# Patient Record
Sex: Male | Born: 1965 | Race: White | Hispanic: No | Marital: Married | Smoking: Former smoker
Health system: Southern US, Community
[De-identification: ages and names within clinical notes are randomized; demographics above are authoritative.]

## PROBLEM LIST (undated history)

## (undated) DIAGNOSIS — E7800 Pure hypercholesterolemia, unspecified: Secondary | ICD-10-CM

## (undated) DIAGNOSIS — Z8489 Family history of other specified conditions: Secondary | ICD-10-CM

## (undated) DIAGNOSIS — F418 Other specified anxiety disorders: Secondary | ICD-10-CM

## (undated) DIAGNOSIS — M1990 Unspecified osteoarthritis, unspecified site: Secondary | ICD-10-CM

## (undated) HISTORY — PX: APPENDECTOMY: SHX54

---

## 1999-12-09 ENCOUNTER — Encounter: Payer: Self-pay | Admitting: Emergency Medicine

## 1999-12-09 ENCOUNTER — Emergency Department (HOSPITAL_COMMUNITY): Admission: EM | Admit: 1999-12-09 | Discharge: 1999-12-09 | Payer: Self-pay | Admitting: Emergency Medicine

## 2007-09-01 ENCOUNTER — Emergency Department (HOSPITAL_COMMUNITY): Admission: EM | Admit: 2007-09-01 | Discharge: 2007-09-01 | Payer: Self-pay | Admitting: Emergency Medicine

## 2010-12-27 ENCOUNTER — Encounter: Payer: Self-pay | Admitting: Emergency Medicine

## 2010-12-27 ENCOUNTER — Emergency Department (INDEPENDENT_AMBULATORY_CARE_PROVIDER_SITE_OTHER): Payer: BC Managed Care – PPO

## 2010-12-27 ENCOUNTER — Observation Stay (HOSPITAL_COMMUNITY)
Admission: AD | Admit: 2010-12-27 | Discharge: 2010-12-29 | Disposition: A | Discharge status: Home / Self Care | Payer: BC Managed Care – PPO | Source: Other Acute Inpatient Hospital | Attending: Family Medicine | Admitting: Family Medicine

## 2010-12-27 ENCOUNTER — Emergency Department (HOSPITAL_BASED_OUTPATIENT_CLINIC_OR_DEPARTMENT_OTHER)
Admission: EM | Admit: 2010-12-27 | Discharge: 2010-12-27 | Disposition: A | Discharge status: Other Acute Inpatient Hospital | Payer: BC Managed Care – PPO | Source: Home / Self Care | Attending: Emergency Medicine | Admitting: Emergency Medicine

## 2010-12-27 ENCOUNTER — Other Ambulatory Visit: Payer: Self-pay

## 2010-12-27 DIAGNOSIS — Z01812 Encounter for preprocedural laboratory examination: Secondary | ICD-10-CM | POA: Insufficient documentation

## 2010-12-27 DIAGNOSIS — Y9289 Other specified places as the place of occurrence of the external cause: Secondary | ICD-10-CM | POA: Insufficient documentation

## 2010-12-27 DIAGNOSIS — S0081XA Abrasion of other part of head, initial encounter: Secondary | ICD-10-CM

## 2010-12-27 DIAGNOSIS — S161XXA Strain of muscle, fascia and tendon at neck level, initial encounter: Secondary | ICD-10-CM

## 2010-12-27 DIAGNOSIS — I498 Other specified cardiac arrhythmias: Secondary | ICD-10-CM | POA: Insufficient documentation

## 2010-12-27 DIAGNOSIS — S0120XA Unspecified open wound of nose, initial encounter: Secondary | ICD-10-CM

## 2010-12-27 DIAGNOSIS — R55 Syncope and collapse: Principal | ICD-10-CM | POA: Insufficient documentation

## 2010-12-27 DIAGNOSIS — W19XXXA Unspecified fall, initial encounter: Secondary | ICD-10-CM | POA: Insufficient documentation

## 2010-12-27 DIAGNOSIS — S0180XA Unspecified open wound of other part of head, initial encounter: Secondary | ICD-10-CM | POA: Insufficient documentation

## 2010-12-27 DIAGNOSIS — M542 Cervicalgia: Secondary | ICD-10-CM

## 2010-12-27 DIAGNOSIS — F3289 Other specified depressive episodes: Secondary | ICD-10-CM | POA: Insufficient documentation

## 2010-12-27 DIAGNOSIS — F1210 Cannabis abuse, uncomplicated: Secondary | ICD-10-CM | POA: Insufficient documentation

## 2010-12-27 DIAGNOSIS — S01511A Laceration without foreign body of lip, initial encounter: Secondary | ICD-10-CM

## 2010-12-27 DIAGNOSIS — E876 Hypokalemia: Secondary | ICD-10-CM | POA: Insufficient documentation

## 2010-12-27 DIAGNOSIS — F329 Major depressive disorder, single episode, unspecified: Secondary | ICD-10-CM | POA: Insufficient documentation

## 2010-12-27 DIAGNOSIS — IMO0002 Reserved for concepts with insufficient information to code with codable children: Secondary | ICD-10-CM | POA: Insufficient documentation

## 2010-12-27 DIAGNOSIS — R51 Headache: Secondary | ICD-10-CM

## 2010-12-27 DIAGNOSIS — S139XXA Sprain of joints and ligaments of unspecified parts of neck, initial encounter: Secondary | ICD-10-CM | POA: Insufficient documentation

## 2010-12-27 DIAGNOSIS — Z01810 Encounter for preprocedural cardiovascular examination: Secondary | ICD-10-CM | POA: Insufficient documentation

## 2010-12-27 DIAGNOSIS — M47812 Spondylosis without myelopathy or radiculopathy, cervical region: Secondary | ICD-10-CM

## 2010-12-27 DIAGNOSIS — F17200 Nicotine dependence, unspecified, uncomplicated: Secondary | ICD-10-CM | POA: Insufficient documentation

## 2010-12-27 DIAGNOSIS — S01501A Unspecified open wound of lip, initial encounter: Secondary | ICD-10-CM | POA: Insufficient documentation

## 2010-12-27 DIAGNOSIS — E7800 Pure hypercholesterolemia, unspecified: Secondary | ICD-10-CM | POA: Insufficient documentation

## 2010-12-27 DIAGNOSIS — S0181XA Laceration without foreign body of other part of head, initial encounter: Secondary | ICD-10-CM

## 2010-12-27 DIAGNOSIS — M5030 Other cervical disc degeneration, unspecified cervical region: Secondary | ICD-10-CM

## 2010-12-27 DIAGNOSIS — F1410 Cocaine abuse, uncomplicated: Secondary | ICD-10-CM | POA: Insufficient documentation

## 2010-12-27 HISTORY — DX: Other specified anxiety disorders: F41.8

## 2010-12-27 LAB — DIFFERENTIAL
Basophils Absolute: 0.1 10*3/uL (ref 0.0–0.1)
Basophils Absolute: 0 10*3/uL (ref 0.0–0.1)
Basophils Relative: 1 % (ref 0–1)
Eosinophils Absolute: 0.9 10*3/uL — ABNORMAL HIGH (ref 0.0–0.7)
Eosinophils Relative: 8 % — ABNORMAL HIGH (ref 0–5)
Eosinophils Relative: 1 % (ref 0–5)
Lymphocytes Relative: 23 % (ref 12–46)
Lymphocytes Relative: 12 % (ref 12–46)
Lymphs Abs: 2.5 10*3/uL (ref 0.7–4.0)
Lymphs Abs: 2 10*3/uL (ref 0.7–4.0)
Monocytes Absolute: 0.7 10*3/uL (ref 0.1–1.0)
Monocytes Relative: 7 % (ref 3–12)
Neutro Abs: 6.9 10*3/uL (ref 1.7–7.7)
Neutrophils Relative %: 62 % (ref 43–77)
Neutrophils Relative %: 81 % — ABNORMAL HIGH (ref 43–77)

## 2010-12-27 LAB — CBC
HCT: 45.9 % (ref 39.0–52.0)
HCT: 45.9 % (ref 39.0–52.0)
Hemoglobin: 15.9 g/dL (ref 13.0–17.0)
MCH: 30.9 pg (ref 26.0–34.0)
MCHC: 34.6 g/dL (ref 30.0–36.0)
MCV: 89.3 fL (ref 78.0–100.0)
MCV: 89.3 fL (ref 78.0–100.0)
Platelets: 220 10*3/uL (ref 150–400)
Platelets: 231 10*3/uL (ref 150–400)
RBC: 5.14 MIL/uL (ref 4.22–5.81)
RBC: 5.14 MIL/uL (ref 4.22–5.81)
RDW: 13.3 % (ref 11.5–15.5)
RDW: 13.1 % (ref 11.5–15.5)
WBC: 11.1 10*3/uL — ABNORMAL HIGH (ref 4.0–10.5)
WBC: 15.8 10*3/uL — ABNORMAL HIGH (ref 4.0–10.5)

## 2010-12-27 LAB — BASIC METABOLIC PANEL
BUN: 10 mg/dL (ref 6–23)
CO2: 22 mEq/L (ref 19–32)
CO2: 27 mEq/L (ref 19–32)
Calcium: 9.4 mg/dL (ref 8.4–10.5)
Chloride: 105 mEq/L (ref 96–112)
Chloride: 106 mEq/L (ref 96–112)
GFR calc Af Amer: >60 mL/min (ref >60)
Glucose, Bld: 158 mg/dL — ABNORMAL HIGH (ref 70–99)
Potassium: 3.7 mEq/L (ref 3.5–5.1)
Potassium: 3.8 mEq/L (ref 3.5–5.1)
Sodium: 139 mEq/L (ref 135–145)

## 2010-12-27 LAB — D-DIMER, QUANTITATIVE: D-Dimer, Quant: 0.4 ug/mL-FEU (ref 0.00–0.48)

## 2010-12-27 LAB — CARDIAC PANEL(CRET KIN+CKTOT+MB+TROPI)
Relative Index: 1.6 (ref 0.0–2.5)
Total CK: 202 U/L (ref 7–232)
Troponin I: <0.3 ng/mL (ref <0.30)

## 2010-12-27 LAB — BASIC METABOLIC PANEL WITH GFR
BUN: 12 mg/dL (ref 6–23)
Creatinine, Ser: 0.9 mg/dL (ref 0.50–1.35)
GFR calc Af Amer: >60 mL/min (ref >60)
GFR calc non Af Amer: >60 mL/min (ref >60)

## 2010-12-27 LAB — TROPONIN I: Troponin I: <0.3 ng/mL (ref <0.30)

## 2010-12-27 MED ORDER — FENTANYL CITRATE 0.05 MG/ML IJ SOLN
50.0000 ug | Freq: Once | INTRAMUSCULAR | Status: AC
  Administered 2010-12-27: 50 ug via INTRAVENOUS
  Filled 2010-12-27: qty 2

## 2010-12-27 MED ORDER — LIDOCAINE-EPINEPHRINE-TETRACAINE (LET) SOLUTION
3.0000 mL | Freq: Once | NASAL | Status: AC
  Administered 2010-12-27: 3 mL via TOPICAL
  Filled 2010-12-27: qty 9

## 2010-12-27 MED ORDER — LIDOCAINE HCL (PF) 1 % IJ SOLN
5.0000 mL | Freq: Once | INTRAMUSCULAR | Status: AC
  Administered 2010-12-27: 5 mL

## 2010-12-27 MED ORDER — LIDOCAINE HCL (PF) 1 % IJ SOLN
INTRAMUSCULAR | Status: AC
  Administered 2010-12-27: 5 mL
  Filled 2010-12-27: qty 5

## 2010-12-27 MED ORDER — SODIUM CHLORIDE 0.9 % IV SOLN
Freq: Once | INTRAVENOUS | Status: AC
  Administered 2010-12-27: 12:00:00 via INTRAVENOUS

## 2010-12-27 MED ORDER — POTASSIUM CHLORIDE 10 MEQ/100ML IV SOLN
10.0000 meq | Freq: Once | INTRAVENOUS | Status: AC
  Administered 2010-12-27: 10 meq via INTRAVENOUS
  Filled 2010-12-27: qty 100

## 2010-12-27 MED ORDER — LIDOCAINE-EPINEPHRINE-TETRACAINE (LET) SOLUTION
3.0000 mL | Freq: Once | NASAL | Status: AC
  Administered 2010-12-27: 3 mL via TOPICAL

## 2010-12-27 MED ORDER — POTASSIUM CHLORIDE CRYS ER 20 MEQ PO TBCR
40.0000 meq | EXTENDED_RELEASE_TABLET | Freq: Once | ORAL | Status: AC
  Administered 2010-12-27: 40 meq via ORAL
  Filled 2010-12-27: qty 2

## 2010-12-27 NOTE — ED Notes (Signed)
Medic notified staff of cbg was 162 mg/dcltr.

## 2010-12-27 NOTE — ED Notes (Signed)
Patient stated he fell and was unsure if he lost consciousness.

## 2010-12-27 NOTE — ED Notes (Addendum)
I cleaned wounds with normal saline after nurse applied LET to numb. I used a syringe and i.v. Cath to flush. I then used cotton swab around nose, then I applied extra saline soaked gauze to wounds to loosen dried blood from edges.

## 2010-12-27 NOTE — ED Provider Notes (Signed)
History     CSN: [id number] Arrival date & time: 12/27/2010 11:20 AM  Chief Complaint  Patient presents with  . Facial Pain  . Shoulder Pain  . Headache  . Facial Laceration   HPI Comments: The patient arrives by EMS with c-collar in place and on spine board. The patient has an IV are established. The patient has dressings applied to his forehead due to an injury. The patient recalls standing briefly feeling some mild dizziness and then passing out and falling face first onto concrete. The patient has obvious traumatic injuries to his face and multiple abrasions to his left arm. He has some mild discomfort to his left shoulder but good range of motion. The patient denies any back pain or pain to his chest abdomen or his legs. The patient reports that he did not eat breakfast this morning but that is his usual. In she did develop diaphoresis but never had any chest pain or shortness of breath. The patient is otherwise healthy except for a history of depression for which he takes medications daily. He admits to smoking and smoking marijuana. He denies any drug use. He reports he was knocked unconscious for very long if any. He denies any shaking of his limbs or any apparent seizure activity. He did not have any bowel or bladder incontinence. Patient's mother reports no family history of seizures. Patient has moderate pain around his face and forehead. The patient reports some mild neck pain more to the left side. He denies any recent long distance travel. She denies any black, tarry, bloody stools. He denies any recent fevers, cold symptoms, cough.  Patient is a 45 y.o. male presenting with shoulder pain and headaches. The history is provided by the patient.  Shoulder Pain Associated symptoms include headaches.  Headache     Past Medical History  Diagnosis Date  . Depression with anxiety     History reviewed. No pertinent past surgical history.  History reviewed. No pertinent family  history.  History  Substance Use Topics  . Smoking status: Current Everyday Smoker -- 1.0 packs/day for 10 years    Types: Cigarettes  . Smokeless tobacco: Not on file  . Alcohol Use: No      Review of Systems  Neurological: Positive for headaches.  All other systems reviewed and are negative.    Physical Exam  BP 122/90  Pulse 73  Temp(Src) 97.9 F (36.6 C) (Oral)  Resp 20  SpO2 97%  Physical Exam  Constitutional: He appears well-developed and well-nourished.  HENT:  Head:    Right Ear: External ear normal.  Left Ear: External ear normal.  Eyes: Conjunctivae are normal. Pupils are equal, round, and reactive to light.  Cardiovascular: Normal rate and regular rhythm.   Pulmonary/Chest: Breath sounds normal. He has no wheezes. He has no rales.  Abdominal: Soft. Bowel sounds are normal. He exhibits no distension. There is no tenderness. There is no rebound.  Musculoskeletal:       Left shoulder: He exhibits tenderness. He exhibits normal range of motion, no bony tenderness, no swelling, no crepitus, no deformity and normal strength.       Arms: Skin: Skin is warm. He is diaphoretic. No pallor.    ED Course  LACERATION REPAIR Date/Time: 12/27/2010 2:54 PM Performed by: Lear Ng Authorized by: Lear Ng Consent: Verbal consent obtained. Written consent not obtained. Consent given by: patient Patient understanding: patient states understanding of the procedure being performed Patient consent: the patient's understanding  of the procedure matches consent given Patient identity confirmed: verbally with patient Time out: Immediately prior to procedure a "time out" was called to verify the correct patient, procedure, equipment, support staff and site/side marked as required. Body area: head/neck Location details: forehead Laceration length: 3 cm Tendon involvement: none Nerve involvement: none Vascular damage: no Anesthesia: local infiltration Local  anesthetic: LET (lido,epi,tetracaine) and lidocaine 1% without epinephrine Patient sedated: no Irrigation solution: saline Irrigation method: syringe Amount of cleaning: standard Debridement: minimal Degree of undermining: none Skin closure: 4-0 Prolene Number of sutures: 3 Technique: simple Approximation: loose Approximation difficulty: simple Patient tolerance: Patient tolerated the procedure well with no immediate complications.  LACERATION REPAIR Date/Time: 12/27/2010 2:55 PM Performed by: Lear Ng Authorized by: Lear Ng Consent: Verbal consent obtained. Written consent not obtained. Risks and benefits: risks, benefits and alternatives were discussed Consent given by: patient Patient understanding: patient states understanding of the procedure being performed Patient consent: the patient's understanding of the procedure matches consent given Procedure consent: procedure consent matches procedure scheduled Patient identity confirmed: verbally with patient Body area: head/neck Location details: lower lip Full thickness lip laceration: yes Vermillion border involved: yes Lip laceration height: more than half vertical height Laceration length: 2.5 cm Tendon involvement: none Nerve involvement: none Vascular damage: no Anesthesia: local infiltration Local anesthetic: lidocaine 1% without epinephrine and LET (lido,epi,tetracaine) Anesthetic total: 1 ml Patient sedated: no Irrigation solution: saline Irrigation method: syringe Amount of cleaning: standard Debridement: none Skin closure: 5-0 Prolene Number of sutures: 3 Technique: simple Approximation: close Approximation difficulty: complex Lip approximation: vermillion border well aligned Patient tolerance: Patient tolerated the procedure well with no immediate complications. Comments: Explained to patient the difficulty of aligning Vermillion border as laceration involved corner of lip.  I did noo  suture deeper into inside of lip as it was approximated and MM heals well.      MDM ECG at 11:23 AM shows rate of 70 with multiple PAC's in bigeminy pattern, normal axis, normal intervals, normal ST and T wave segments  No prior ECG  Patient appears to have had true syncope despite the fact that he claims he felt mildly dizzy. The patient obviously fell face forward without much attempt to catch himself. He denies any seizure activity and had urinary incontinence I don't favor seizure although it is possible. He did not have any chest pain or shortness of breath. However given his syncope we'll check cardiac markers, EKG, blood tests, d-dimer. Patient will need suturing of his forehead and lip lacerations. The patient may require admission for his syncope workup. His primary care doctor is Dr. Paulino Rily would equal at [facility] [place].      2:59 PM Pt reports no palpitations, CP.  Still with cervical pain and lower cervical upper T spine tender, although more lateral to left, will get CT of C spine as well.  No focal neuro deficits to suggest cord injury.  Will admit for true syncope and need for tele monitoring and observation.  Sutures of face can be removed in 3- 4 days.    K+ ordered prior to transport after discussion with Dr. Jordan Hawks who accepts pt to [facility], team 6, tele bed  [name] [name]. Alainah Phang, MD 12/27/10 [phone]

## 2010-12-28 ENCOUNTER — Inpatient Hospital Stay (HOSPITAL_COMMUNITY): Payer: BC Managed Care – PPO

## 2010-12-28 DIAGNOSIS — R55 Syncope and collapse: Secondary | ICD-10-CM

## 2010-12-28 LAB — URINE DRUGS OF ABUSE SCREEN W ALC, ROUTINE (REF LAB)
Amphetamine Screen, Ur: NEGATIVE
Barbiturate Quant, Ur: NEGATIVE
Cocaine Metabolites: POSITIVE — AB
Creatinine,U: 86.9 mg/dL
Ethyl Alcohol: <10 mg/dL (ref <10)
Methadone: NEGATIVE
Phencyclidine (PCP): NEGATIVE

## 2010-12-28 LAB — TSH: TSH: 0.697 u[IU]/mL (ref 0.350–4.500)

## 2010-12-28 LAB — URINALYSIS, ROUTINE W REFLEX MICROSCOPIC
Bilirubin Urine: NEGATIVE
Glucose, UA: NEGATIVE mg/dL
Hgb urine dipstick: NEGATIVE
Specific Gravity, Urine: 1.008 (ref 1.005–1.030)
pH: 7 (ref 5.0–8.0)

## 2010-12-28 LAB — CARDIAC PANEL(CRET KIN+CKTOT+MB+TROPI)
CK, MB: 2.7 ng/mL (ref 0.3–4.0)
Relative Index: 1.7 (ref 0.0–2.5)
Total CK: 162 U/L (ref 7–232)
Troponin I: <0.3 ng/mL (ref <0.30)

## 2011-01-01 LAB — COCAINE, URINE, CONFIRMATION: Benzoylecgonine GC/MS Conf: 276 NG/ML — ABNORMAL HIGH

## 2011-01-24 NOTE — Discharge Summary (Signed)
  NAME:  [name] [name], [name] [name]               ACCOUNT NO.:  [id number]  MEDICAL RECORD NO.:  [id number]  LOCATION:  4742                         FACILITY:  [facility]  PHYSICIAN:  Tarry Kos, MD       DATE OF BIRTH:  13-Oct-1965  DATE OF ADMISSION:  12/27/2010 DATE OF DISCHARGE:  12/29/2010                              DISCHARGE SUMMARY   DISCHARGE DIAGNOSES: 1. Syncopal episode. 2. Polysubstance abuse including cocaine. 3. Facial lacerations from syncopal episode with stitches placed on     December 27, 2010.  SUMMARY OF HOSPITAL COURSE:  [name] is a 45 year old male who presented to the ED after having a syncopal episode, fell face forward, and suffered some injuries to his facial and forehead area.  These were repaired by the ED.  He was admitted for workup.  He had a MRI which was negative, a 2-D echo which was also normal, and carotid Dopplers which preliminary report is normal.  His telemetry did not reveal any arrhythmias, however, his urine drug screen was positive for cocaine and marijuana.  I did speak to [name] and he says that he rarely uses cocaine, however, I have strongly advised him against using cocaine in the future.  I have explained that cocaine can cause deadly cardiac arrest due to coronary vasospasms and arrhythmias and this could have been the cause of syncopal episode.  He understands this and says he is not going to proceed with any cocaine use in the future.  His workup here was negative and he had serial cardiac enzymes which were all negative.  Thyroid studies were normal.  Urinalysis was negative.  D- dimer was normal.  White count was normal.  Hemoglobin was normal. Electrolytes were normal.  BUN and creatinine were normal.  Chest x-ray showed COPD changes with no acute abnormalities.  The patient is being discharged home to follow up with primary care physician within the next week.  He will need to have his sutures removed on Friday.  PHYSICAL  EXAMINATION:  VITAL SIGNS:  He has been afebrile.  Vital signs stable. GENERAL:  Alert and oriented x4.  No apparent stress, cooperative, and friendly. HEENT:  Extraocular muscles intact.  Pupils equal, round, and reactive to light.  Oropharynx clear.  Mucous membranes moist. NECK:  No JVD, no carotid bruits. COR:  Regular rate and rhythm without murmurs, rubs, or gallops. CHEST:  Clear to auscultation bilaterally.  No wheezes or rales. ABDOMEN:  Soft and nontender.  Positive bowel sounds.  No hepatosplenomegaly. EXTREMITIES:  No clubbing, cyanosis, or edema. PSYCH:  Normal affect. NEURO:  Cranial nerves II-XII grossly intact.  No focal neurologic deficits.  5/5 strength in upper and lower extremities.  He had no focal neurologic deficits throughout this hospitalization. SKIN:  No rashes.          ______________________________ Tarry Kos, MD     RD/MEDQ  D:  12/29/2010  T:  12/29/2010  Job:  [id number]  Electronically Signed by Tarry Kos MD on 01/24/2011 11:54:55 AM

## 2011-01-27 NOTE — H&P (Signed)
NAME:  [name], [name]               ACCOUNT NO.:  [id number]  MEDICAL RECORD NO.:  [id number]  LOCATION:  4742                         FACILITY:  [facility]  PHYSICIAN:  Jonny Ruiz, MD    DATE OF BIRTH:  08-Sep-1965  DATE OF ADMISSION:  12/27/2010 DATE OF DISCHARGE:                             HISTORY & PHYSICAL   CHIEF COMPLAINT:  Passed out.  HISTORY OF PRESENT ILLNESS:  The patient is a 45 year old healthy man who was in his usual state of health until this morning when he went to work while standing in front of his Zenaida Niece, he suddenly collapsed and fell forward causing injuries in his forehead requiring suture repair.  The patient states that he had no warning symptoms prior to falling including headaches, dizziness, lightheadedness, nausea, diaphoresis, chest pain, shortness of breath or palpitations.  He has no history of cardiac or pulmonary disorders.  No history of seizures.  Denied any tongue biting or urinary incontinence.  The patient did not have any postsyncopal headache other than the headache.  The patient did have some neck pain and shoulder pain after the fall.  The patient was seen initially at [facility] and was subsequently directly admitted to telemetry unit on room 4742 for further workup and management.  At the present time the patient is asymptomatic.  He has never had syncopal episodes before.  PAST MEDICAL HISTORY: 1. Hypercholesterolemia. 2. Appendectomy at age 33. 3. Irritability/anger issues.  MEDICATIONS:  Sertraline 100 mg a day.  ALLERGIES:  No known drug allergies.  SOCIAL HISTORY:  The patient lives with his wife and 2 children, ages 99 and 85, boy and girl respectively.  He works for [employer].  He smokes 1 pack per day for many years.  Alcohol almost none he says. Last drink about 6 months ago.  Drugs, he smokes weed.  FAMILY HISTORY:  Mother is alive and suffers from thyroid disorder. Father is alive and has  extensive coronary artery disease requiring CABG.  He only has one half-sister who had a stroke.  She is 13 years younger than him.  REVIEW OF SYSTEMS:  CONSTITUTIONAL:  Denies fever, chills, sweats, weight changes or adenopathy.  HEENT:  Denies headache, sore throat, nasal congestion, nasal bleed, nasal discharge, phonophobia, photophobia or visual or hearing impairment.  SKIN:  He has got abrasions and lacerations in the forehead, nose and upper lip.  CARDIOPULMONARY: Denies chest pain, shortness of breath, palpitations, dyspnea on exertion, orthopnea, nocturia or PND.  Denies claudication.  He does have a morning cough that has been with him for many years.  Denies wheezing.  GI:  Denies nausea, vomiting, diarrhea, hematochezia or melena.  Denies changes in bowel habits.  Denies reflux.  GU:  Denies dysuria, frequency or hematuria.  MUSCULOSKELETAL:  Positive for neck pain.  NEUROPSYCHIATRIC:  The patient states that he is not depressed and does not suffer from anxiety and he takes sertraline for anger issues which has helped.  ENDOCRINE:  Denies polyuria, polydipsia, heat or cold intolerance.  PHYSICAL EXAMINATION:  VITAL SIGNS:  Temperature 98.1, pulse lying down 68, pulse standing 66, blood pressure lying 128/79, standing 138/87, respirations 18, pulse ox  98%. GENERAL APPEARANCE:  The patient is a middle age Caucasian man who appears in no acute distress. HEENT:  The patient has abrasions on his forehead with a laceration as well as deep abrasion on the nose as well as a small abrasion with superficial laceration on the upper lip.  PERRLA, EOMI, no epistaxis. NECK:  Without JVD or carotid bruits.  Normal thyroid.  No adenopathy. CARDIOVASCULAR:  Regular S1 and S2 without gallops, murmurs or rubs. LUNGS:  Clear to auscultation. ABDOMEN:  Nondistended with normal bowel sounds, soft and nontender without organomegaly or masses palpable.  EXTREMITIES:  With no clubbing, cyanosis or  edema. NEUROLOGICAL:  Awake and oriented x3, cranial nerves II-XII grossly intact, strength 5/5 in all extremities and axial groups, normal sensation throughout, normal cerebellar function, no nystagmus. GENITAL EXAM AND RECTAL:  Deferred.  RADIOLOGY:  CT scan of C-spine, no acute osseous abnormalities. Multilevel cervical spine degenerative disease and multilevel mild degenerative disk disease and left greater than right facet disease with left foraminal encroachment.  CT head without contrast, nasal bridge and right forehead lacerations.  Normal CT brain.  No fracture.  LABORATORY FINDINGS:  D-dimer and troponin negative.  Basic metabolic panel normal.  CBC; white count 11.1, normal hemoglobin, hematocrit and platelet count.  IMPRESSION:  A 45 year old white male previously healthy presenting with syncopal episode and multiple lacerations and abrasions in the forehead as a result of the syncope.  PLAN:  The patient will be admitted to telemetry floor for further workup and management.  His workup will include serial cardiac enzymes, TSH, chest x-ray, echocardiogram, carotid ultrasound, EEG, and brain MRI.  The patient will be monitored in telemetry for at least 1 day and management will be pending results of these tests and evaluations.  He will continue on sertraline 100 mg a day.  DVT prophylaxis with Lovenox per protocol.  Nicotine patch 21 mg per day.  Tobacco cessation counseling will be provided.          ______________________________ Jonny Ruiz, MD     GL/MEDQ  D:  12/27/2010  T:  12/27/2010  Job:  [id number]  Electronically Signed by Jonny Ruiz MD on 01/27/2011 04:06:29 PM

## 2012-03-29 IMAGING — CT CT CERVICAL SPINE W/O CM
3 of 4 series · 13 of 33 positions shown, 16 images · non-contrast
Comparison: None.

CLINICAL DATA: Headache.  Neck pain.  Facial laceration.

CT CERVICAL SPINE WITHOUT CONTRAST
TECHNIQUE: Multidetector CT imaging of the cervical spine was
performed. Multiplanar CT image reconstructions were also
generated.

[Series 3: c_spine 2.0 b41s st · axial · 0.28mm/px · z∈[+1108,+1224]mm · 5 of 88 slices shown, 7 images]
[im 15/88  soft-tissue]
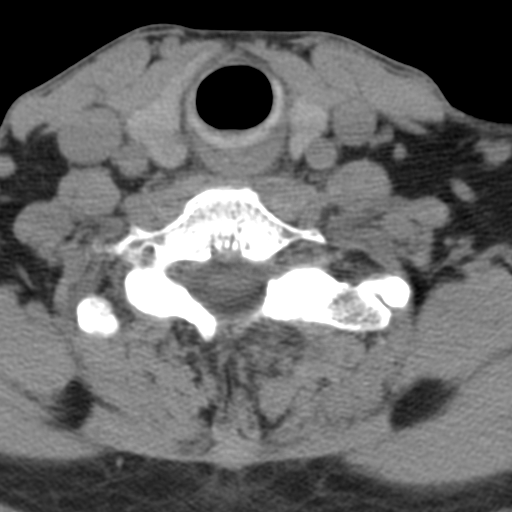
[im 15/88  bone]
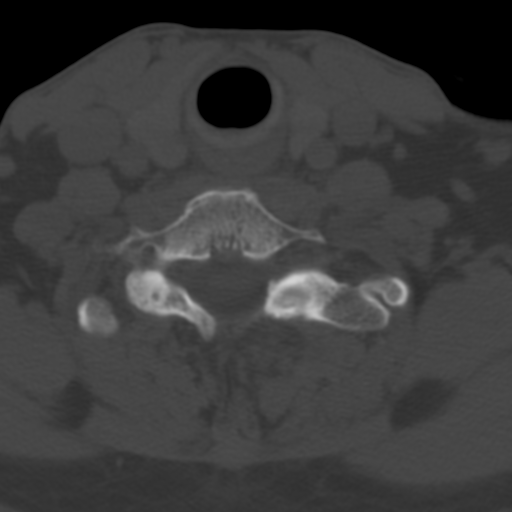
[im 30/88  bone]
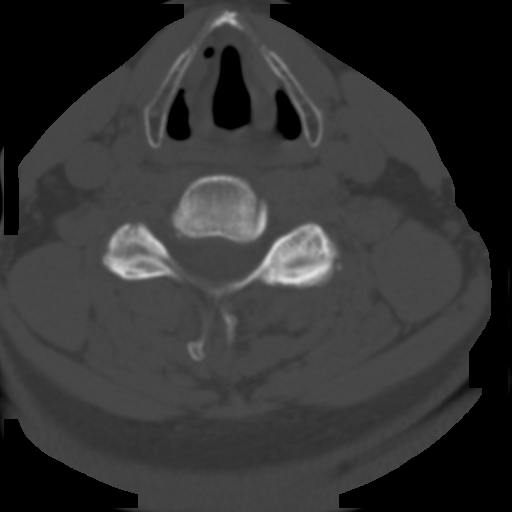
[im 44/88  bone]
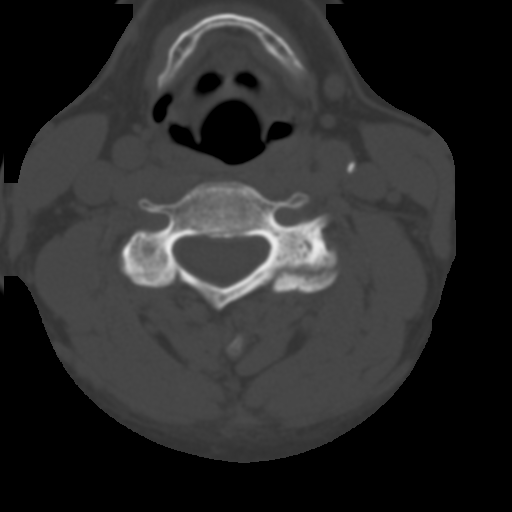
[im 59/88  bone]
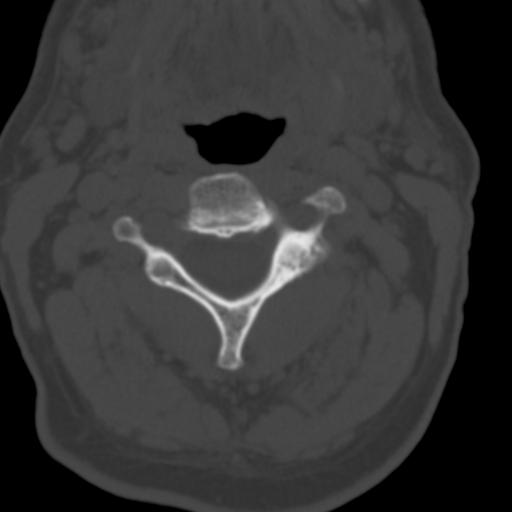
[im 73/88  soft-tissue]
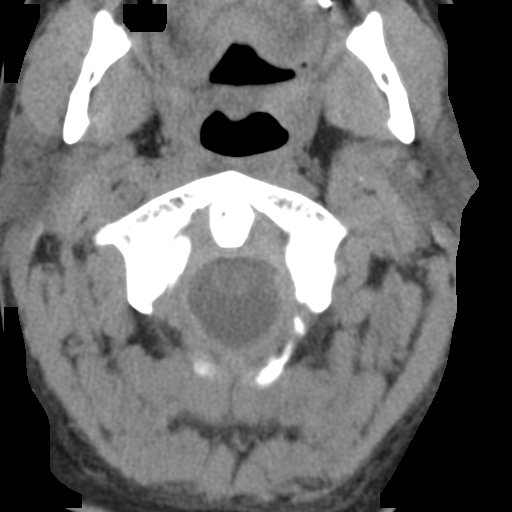
[im 73/88  bone]
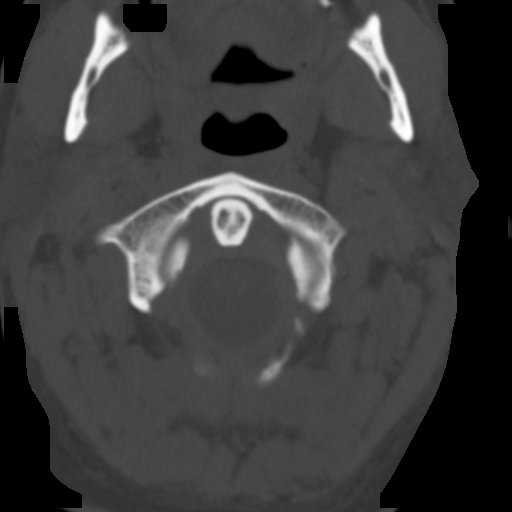

[Series 6: c_spine 2.0 coronal · coronal · 0.27mm/px · 3 of 33 slices shown]
[im 7/33  bone]
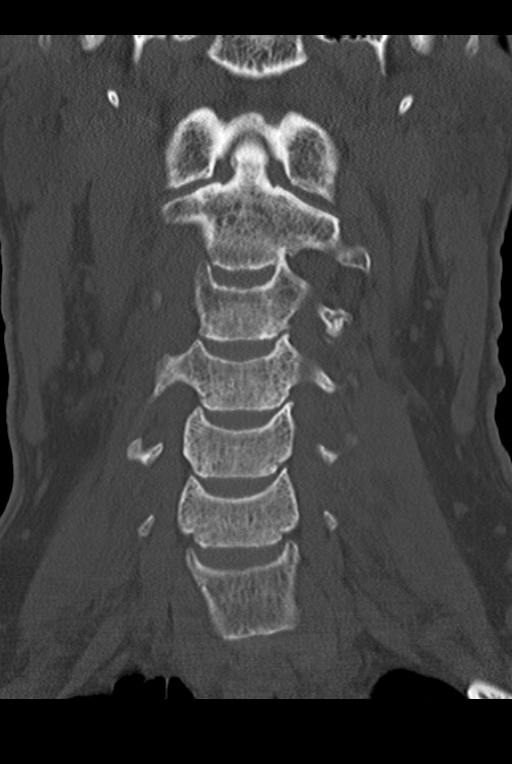
[im 13/33  bone]
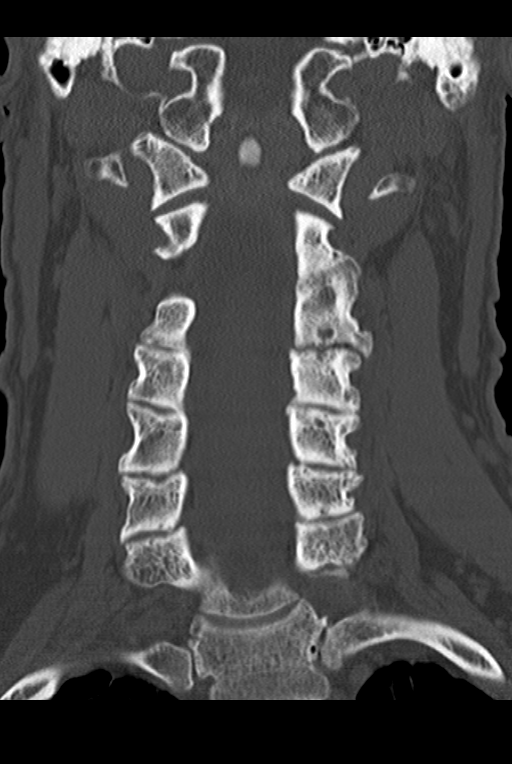
[im 20/33  bone]
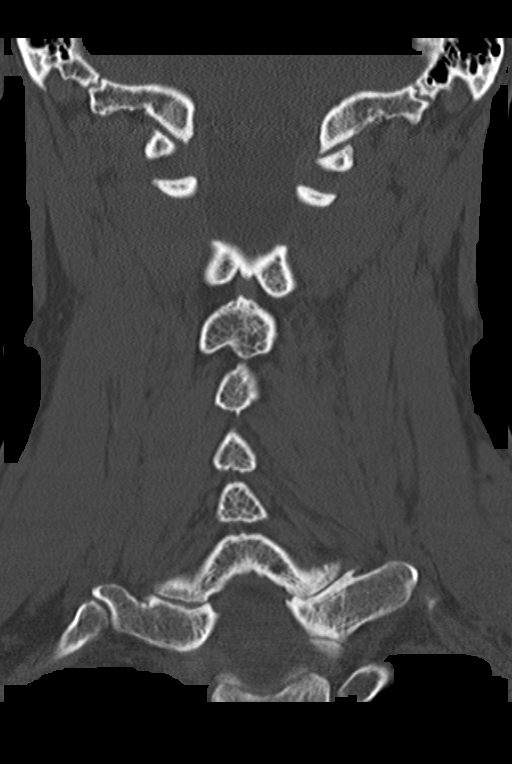

[Series 7: c_spine 2.0 sagittal · sagittal · 0.33mm/px · 5 of 54 slices shown, 6 images]
[im 18/54  bone]
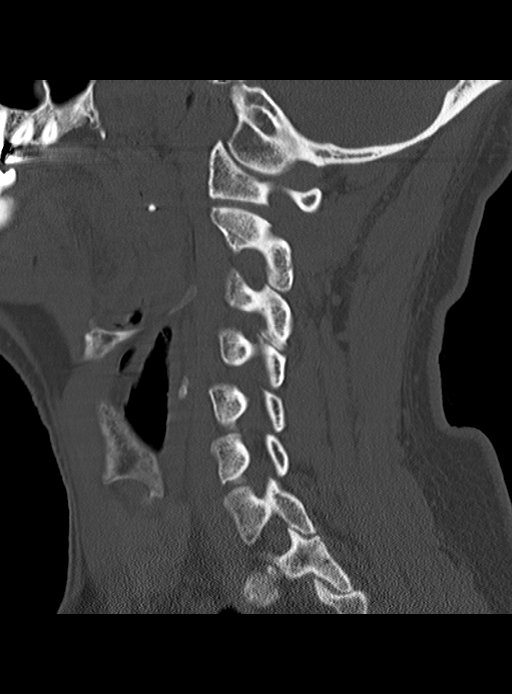
[im 23/54  bone]
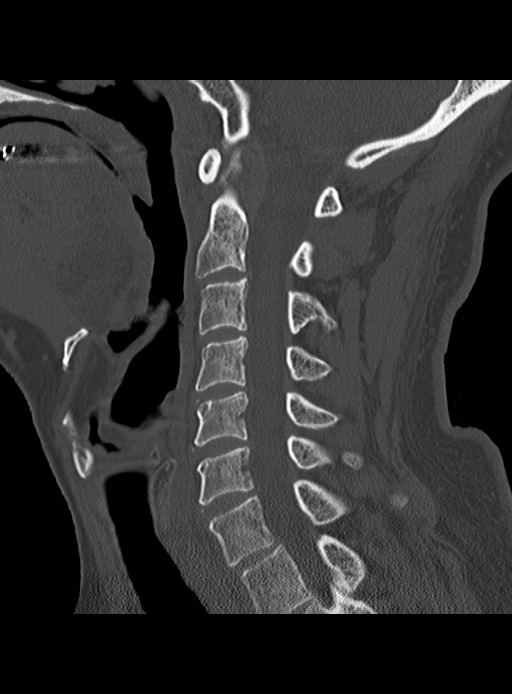
[im 27/54  soft-tissue]
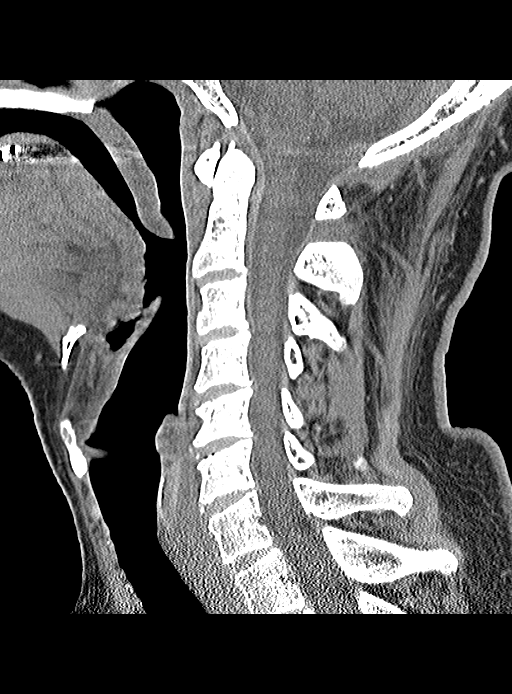
[im 27/54  bone]
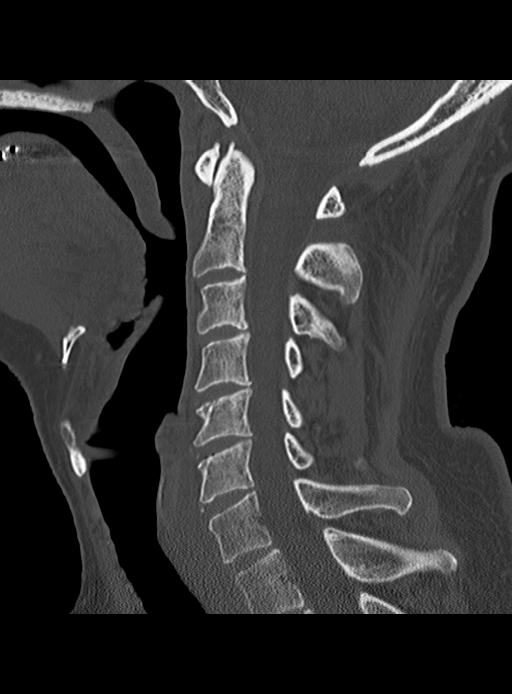
[im 31/54  bone]
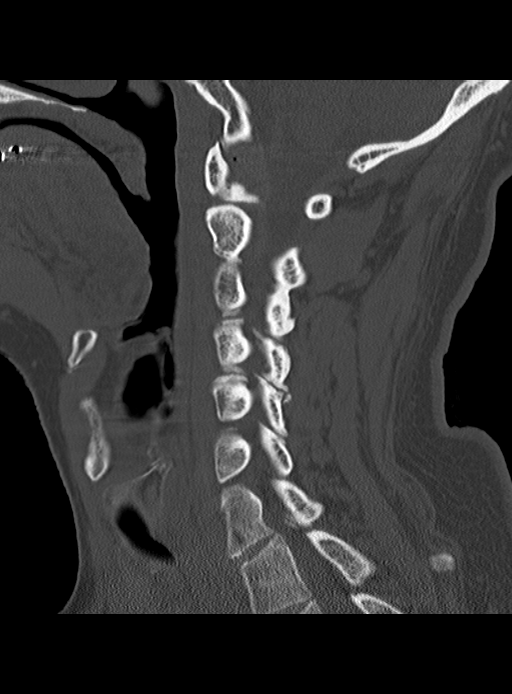
[im 36/54  bone]
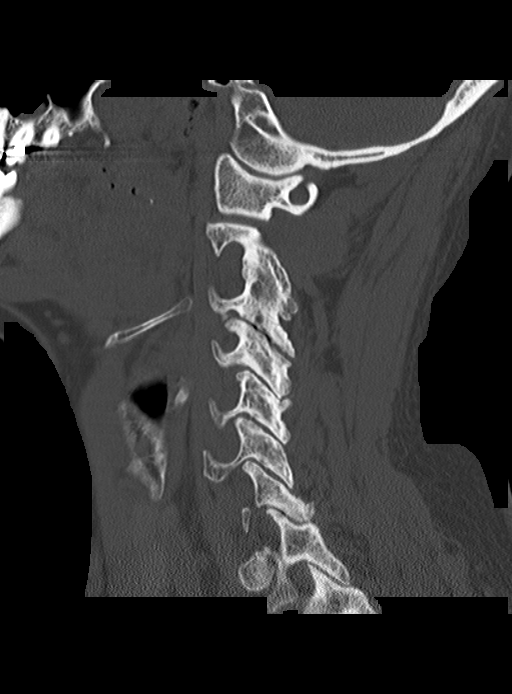

[13 of 33 positions shown; findings below may reference images not displayed]

FINDINGS: There is no cervical collar in place.  The alignment
cervical spine is anatomic.  There is no cervical spine fracture or
dislocation.  Craniocervical alignment is normal.  Ankylosis of the
left C2-C3 facet joints.  Right facet joints remain open.

Multilevel mild cervical spondylosis is present along with cervical
facet arthrosis.  Tonsiliths are incidentally noted.  The
paraspinal soft tissues are within normal limits.  Right apical
emphysema is present.

C2-C3:  Left uncovertebral spurring and left facet arthrosis with
left foraminal encroachment.
C3-C4:  Left foraminal encroachment secondary uncovertebral
spurring and severe facet arthrosis.
C4-C5:  Left greater than right facet arthrosis and foraminal
encroachment with left uncovertebral spurring.
C5-C6:  Bilateral left greater than right moderate bilateral facet
arthrosis.  Mild left foraminal encroachment.
C6-C7:  Right facet degeneration.  No stenosis.
C7-T1:  Negative.
IMPRESSION: No acute osseous abnormality.    Multilevel cervical spine
degenerative disease and multilevel mild degenerative disc disease
and left greater than right facet disease with left foraminal
encroachment.

## 2015-10-12 ENCOUNTER — Ambulatory Visit (INDEPENDENT_AMBULATORY_CARE_PROVIDER_SITE_OTHER): Payer: PRIVATE HEALTH INSURANCE | Admitting: Physician Assistant

## 2015-10-12 VITALS — BP 130/90 | HR 67 | Temp 97.9°F | Resp 16 | Ht 71.5 in | Wt 217.0 lb

## 2015-10-12 DIAGNOSIS — S51811A Laceration without foreign body of right forearm, initial encounter: Secondary | ICD-10-CM | POA: Diagnosis not present

## 2015-10-12 NOTE — Patient Instructions (Addendum)
WOUND CARE Please return in 10 days to have your stitches/staples removed or sooner if you have concerns. . Keep area clean and dry for 24 hours. Do not remove bandage, if applied. . After 24 hours, remove bandage and wash wound gently with mild soap and warm water. Reapply a new bandage after cleaning wound, if directed. . Continue daily cleansing with soap and water until stitches/staples are removed. . Do not apply any ointments or creams to the wound while stitches/staples are in place, as this may cause delayed healing. . Notify the office if you experience any of the following signs of infection: Swelling, redness, pus drainage, streaking, fever >101.0 F . Notify the office if you experience excessive bleeding that does not stop after 15-20 minutes of constant, firm pressure.      IF you received an x-ray today, you will receive an invoice from [facility]. Please contact [facility] at [phone] with questions or concerns regarding your invoice.   IF you received labwork today, you will receive an invoice from [facility]. Please contact [facility] at [phone] with questions or concerns regarding your invoice.   Our billing staff will not be able to assist you with questions regarding bills from these companies.  You will be contacted with the lab results as soon as they are available. The fastest way to get your results is to activate your My Chart account. Instructions are located on the last page of this paperwork. If you have not heard from us regarding the results in 2 weeks, please contact this office.      

## 2015-10-15 NOTE — Progress Notes (Signed)
   10/15/2015 11:16 AM   DOB: 03-Aug-1965 / MRN: [id number]  SUBJECTIVE:  [name] [name] is a 50 y.o. male presenting for a cut to the right posterior forearm sustained one hour ago.  Reports he cut his arm on shelving [place] [facility]  Says the [employer] staff placed "hydrocortisone cream" on the wound and a band-aid and advised that he come here.    Immunization History  Administered Date(s) Administered  . Tdap 10/11/2010     He has No Known Allergies.   He  has a past medical history of Depression with anxiety.    He  reports that he has quit smoking. His smoking use included Cigarettes. He has a 10 pack-year smoking history. He does not have any smokeless tobacco history on file. He reports that he uses illicit drugs (Marijuana). He reports that he does not drink alcohol. He  has no sexual activity history on file. The patient  has no past surgical history on file.  His family history is not on file.  Review of Systems  Constitutional: Negative for fever and chills.  Gastrointestinal: Negative for nausea.  Skin: Negative for rash.  Neurological: Negative for dizziness, tingling and focal weakness.    Problem list and medications reviewed and updated by myself where necessary, and exist elsewhere in the encounter.   OBJECTIVE:  BP 130/90 mmHg  Pulse 67  Temp(Src) 97.9 F (36.6 C) (Oral)  Resp 16  Ht 5' 11.5" (1.816 m)  Wt 217 lb (98.431 kg)  BMI 29.85 kg/m2  SpO2 96%  Physical Exam  Constitutional: He is oriented to person, place, and time. He appears well-developed. He does not appear ill.  Eyes: Conjunctivae and EOM are normal. Pupils are equal, round, and reactive to light.  Cardiovascular: Normal rate.   Pulmonary/Chest: Effort normal.  Abdominal: He exhibits no distension.  Musculoskeletal: Normal range of motion.  Neurological: He is alert and oriented to person, place, and time. No cranial nerve deficit. Coordination normal.  Skin: Skin  is warm and dry. He is not diaphoretic.     Psychiatric: He has a normal mood and affect.  Nursing note and vitals reviewed.  Risk and benefits discussed and verbal consent obtained. Anesthetic allergies reviewed. Patient anesthetized using 1:1 mix of 2% lidocaine with epi and Marcaine. The wound was cleansed thoroughly with soap and water. Sterile prep and drape. Wound closed with 3 throws using 4-0 Prolene suture material. Hemostasis achieved. Mupirocin applied to the wound and bandage placed. The patient tolerated well. Wound instructions were provided and the patient is to return in 10 days for suture removal.   No results found for this or any previous visit (from the past 72 hour(s)).  No results found.  ASSESSMENT AND PLAN  [name] [name] was seen today for laceration.  Diagnoses and all orders for this visit:  Forearm laceration, right, initial encounter: Repaired. RTC in ten days for suture removal.     The patient was advised to call or return to clinic if he does not see an improvement in symptoms or to seek the care of the closest emergency department if he worsens with the above plan.   [name] [name], MHS, PA-C [facility] 10/15/2015 11:16 AM

## 2015-10-23 ENCOUNTER — Ambulatory Visit (INDEPENDENT_AMBULATORY_CARE_PROVIDER_SITE_OTHER): Payer: PRIVATE HEALTH INSURANCE | Admitting: Physician Assistant

## 2015-10-23 VITALS — BP 126/80 | HR 60 | Temp 98.1°F | Resp 18 | Ht 71.5 in | Wt 221.0 lb

## 2015-10-23 DIAGNOSIS — S51811D Laceration without foreign body of right forearm, subsequent encounter: Secondary | ICD-10-CM

## 2015-10-23 NOTE — Progress Notes (Signed)
[facility] [address], [place] [id number] [phone]  Date:  10/23/2015   Name:  [name] [name]   DOB:  [birth year]   MRN:  [id number]  PCP:  No primary care provider on file.    Chief Complaint: Suture / Staple Removal   History of Present Illness:  This is a 50 y.o. male who is presenting for suture removal. He was seen here on 6/9 for repair of a laceration on his right posterior forearm. Happened while working at [employer]. #3 sutures placed. He reports no problems. Denies fever, chills, drainage.   Review of Systems:  Review of Systems See HPI  There are no active problems to display for this patient.   Prior to Admission medications   Medication Sig Start Date End Date Taking? Authorizing Provider  sertraline (ZOLOFT) 100 MG tablet Take 100 mg by mouth daily.     Yes Historical Provider, MD  tiZANidine (ZANAFLEX) 4 MG tablet Take 4 mg by mouth every 6 (six) hours as needed for muscle spasms.   Yes Historical Provider, MD    No Known Allergies  History reviewed. No pertinent past surgical history.  Social History  Substance Use Topics  . Smoking status: Former Smoker -- 1.00 packs/day for 10 years    Types: Cigarettes  . Smokeless tobacco: None  . Alcohol Use: No    History reviewed. No pertinent family history.  Medication list has been reviewed and updated.  Physical Examination:  Physical Exam  Constitutional: He is oriented to person, place, and time. He appears well-developed and well-nourished. No distress.  HENT:  Head: Normocephalic and atraumatic.  Right Ear: Hearing normal.  Left Ear: Hearing normal.  Nose: Nose normal.  Eyes: Conjunctivae and lids are normal. Right eye exhibits no discharge. Left eye exhibits no discharge. No scleral icterus.  Pulmonary/Chest: Effort normal. No respiratory distress.  Musculoskeletal: Normal range of motion.  Neurological: He is alert and oriented to person, place, and time.  Skin:  Skin is warm, dry and intact.  Right forearm with healing laceration. #3 sutures intact. Removed and tolerated well.  Psychiatric: He has a normal mood and affect. His speech is normal and behavior is normal. Thought content normal.   BP 126/80 mmHg  Pulse 60  Temp(Src) 98.1 F (36.7 C) (Oral)  Resp 18  Ht 5' 11.5" (1.816 m)  Wt 221 lb (100.245 kg)  BMI 30.40 kg/m2  SpO2 97%  Assessment and Plan:  1. Forearm laceration, right, subsequent encounter Sutures removed. Return as needed.   Roswell MinersNicole V. Dyke BrackettBush, PA-C, MHS [facility]  10/23/2015

## 2015-10-23 NOTE — Patient Instructions (Signed)
     IF you received an x-ray today, you will receive an invoice from [facility]. Please contact [facility] at [phone] with questions or concerns regarding your invoice.   IF you received labwork today, you will receive an invoice from [facility]. Please contact [facility] at [phone] with questions or concerns regarding your invoice.   Our billing staff will not be able to assist you with questions regarding bills from these companies.  You will be contacted with the lab results as soon as they are available. The fastest way to get your results is to activate your My Chart account. Instructions are located on the last page of this paperwork. If you have not heard from us regarding the results in 2 weeks, please contact this office.      

## 2015-12-20 ENCOUNTER — Ambulatory Visit (INDEPENDENT_AMBULATORY_CARE_PROVIDER_SITE_OTHER): Payer: No Typology Code available for payment source | Admitting: Physician Assistant

## 2015-12-20 ENCOUNTER — Ambulatory Visit (INDEPENDENT_AMBULATORY_CARE_PROVIDER_SITE_OTHER): Payer: No Typology Code available for payment source

## 2015-12-20 VITALS — BP 130/80 | HR 82 | Temp 99.1°F | Resp 16 | Ht 72.0 in | Wt 222.6 lb

## 2015-12-20 DIAGNOSIS — M79645 Pain in left finger(s): Secondary | ICD-10-CM

## 2015-12-20 MED ORDER — NAPROXEN 500 MG PO TABS: 500.0000 mg | ORAL_TABLET | Freq: Two times a day (BID) | ORAL | 0 refills | Status: AC

## 2015-12-20 NOTE — Patient Instructions (Signed)
     IF you received an x-ray today, you will receive an invoice from [facility]. Please contact [facility] at [phone] with questions or concerns regarding your invoice.   IF you received labwork today, you will receive an invoice from [facility]. Please contact [facility] at [phone] with questions or concerns regarding your invoice.   Our billing staff will not be able to assist you with questions regarding bills from these companies.  You will be contacted with the lab results as soon as they are available. The fastest way to get your results is to activate your My Chart account. Instructions are located on the last page of this paperwork. If you have not heard from us regarding the results in 2 weeks, please contact this office.      

## 2015-12-20 NOTE — Progress Notes (Signed)
   12/20/2015 6:33 PM   DOB: 24-Mar-1966 / MRN: [id number]  SUBJECTIVE:  [name] [name] is a 50 y.o. male presenting for complaining of left fifth digit pain after he slammed his finger in a sliding glass door today.  He has not tried anything for the pain. He can move the finger however it is very painful to do so.  Denies any change in sensation.    He has No Known Allergies.   He  has a past medical history of Depression with anxiety.    He  reports that he has quit smoking. His smoking use included Cigarettes. He has a 10.00 pack-year smoking history. He has never used smokeless tobacco. He reports that he uses drugs, including Marijuana. He reports that he does not drink alcohol. He  has no sexual activity history on file. The patient  has no past surgical history on file.  His family history is not on file.  Review of Systems  Constitutional: Negative for fever.  Musculoskeletal: Positive for joint pain. Negative for falls.  Skin: Negative for rash.  Neurological: Negative for sensory change and focal weakness.    The problem list and medications were reviewed and updated by myself where necessary and exist elsewhere in the encounter.   OBJECTIVE:  BP 130/80 (BP Location: Right Arm, Patient Position: Sitting, Cuff Size: Normal)   Pulse 82   Temp 99.1 F (37.3 C) (Oral)   Resp 16   Ht 6' (1.829 m)   Wt 222 lb 9.6 oz (101 kg)   SpO2 95%   BMI 30.19 kg/m   Physical Exam  Constitutional: He is oriented to person, place, and time.  Cardiovascular: Normal rate.   Pulmonary/Chest: Effort normal.  Musculoskeletal: Normal range of motion.       Hands: Neurological: He is alert and oriented to person, place, and time. He has normal strength and normal reflexes. He displays no atrophy and no tremor. No cranial nerve deficit or sensory deficit. He exhibits normal muscle tone. He displays a negative Romberg sign. He displays no seizure activity. Coordination and gait normal. GCS eye  subscore is 4. GCS verbal subscore is 5. GCS motor subscore is 6.       No results found for this or any previous visit (from the past 72 hour(s)).  Dg Hand Complete Left  Result Date: 12/20/2015 CLINICAL DATA:  Left fifth digit pain after hand injury today. EXAM: LEFT HAND - COMPLETE 3+ VIEW COMPARISON:  None. FINDINGS: Osseous alignment is normal. Bone mineralization is normal. No fracture line or displaced fracture fragment seen. Adjacent soft tissues are unremarkable. IMPRESSION: Negative. Electronically Signed   By: Bary RichardStan  Maynard M.D.   On: 12/20/2015 18:16    ASSESSMENT AND PLAN  [name] [name] was seen today for other.  Diagnoses and all orders for this visit:  Finger pain, left: Soft tissue injury. Ace wrapped in an buddy taped ulnar gutter fashion. Rest, ice, compression, elevation.  Naprosyn 500 bid. RTC as needed.   -     DG Hand Complete Left; Future    The patient is advised to call or return to clinic if he does not see an improvement in symptoms, or to seek the care of the closest emergency department if he worsens with the above plan.   Deliah BostonMichael Ardeth Repetto, MHS, PA-C [facility] 12/20/2015 6:33 PM

## 2017-03-22 IMAGING — DX DG HAND COMPLETE 3+V*L*
3 series · 3 of 3 positions shown · non-contrast
Comparison: None.

CLINICAL DATA: Left fifth digit pain after hand injury today.

EXAM:
LEFT HAND - COMPLETE 3+ VIEW

[hand pa]
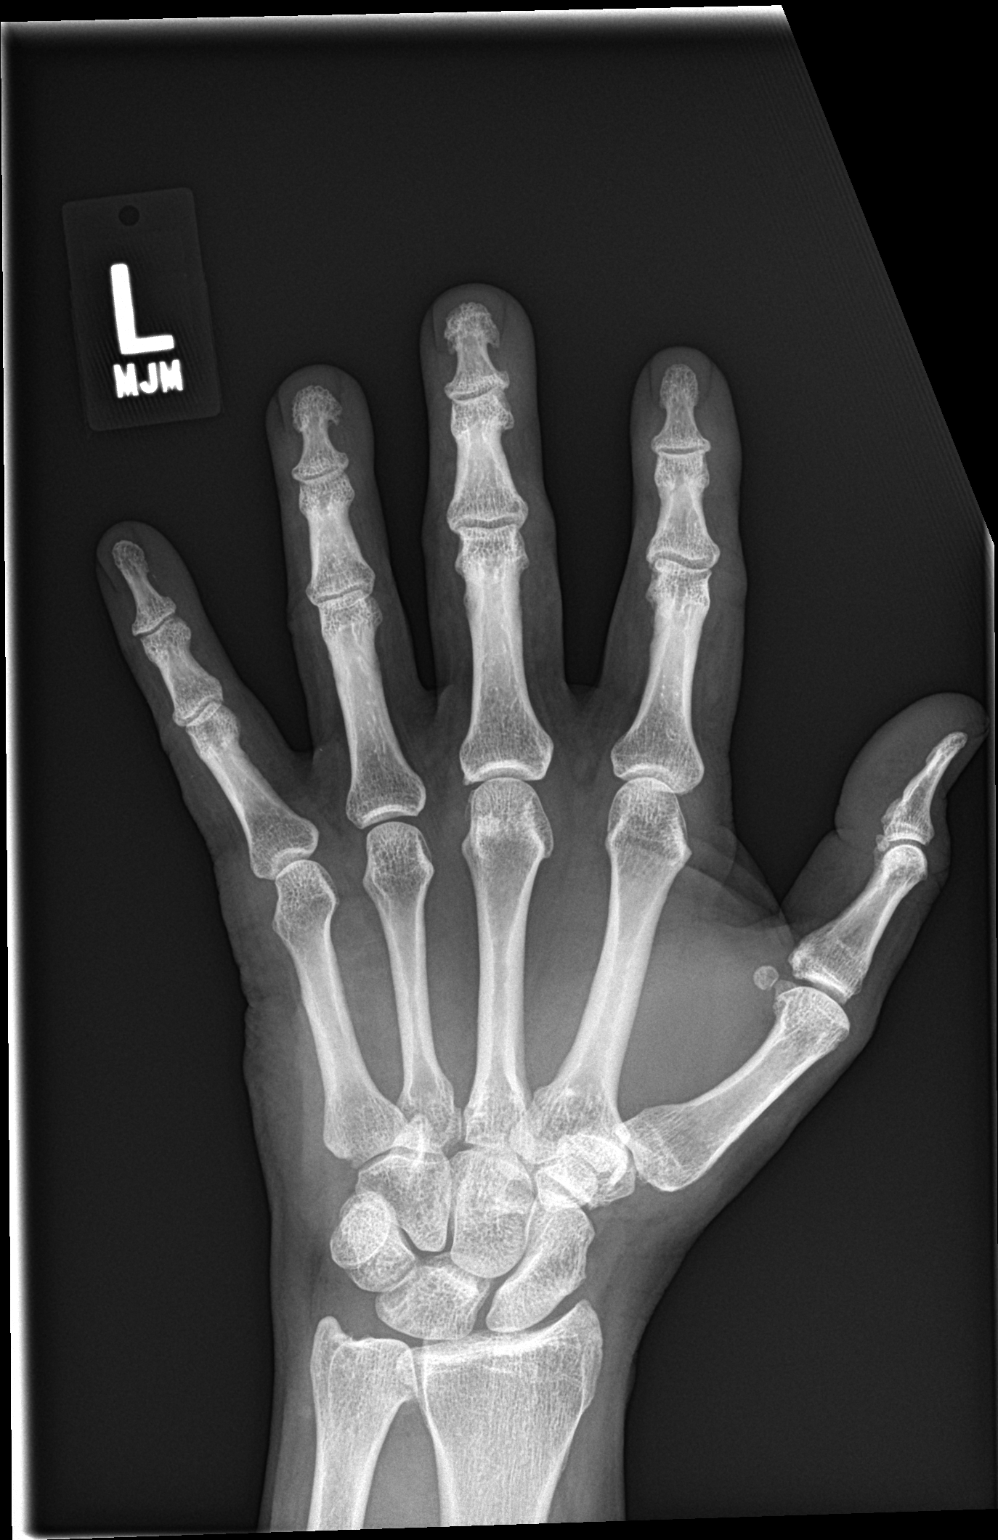

[hand obl]
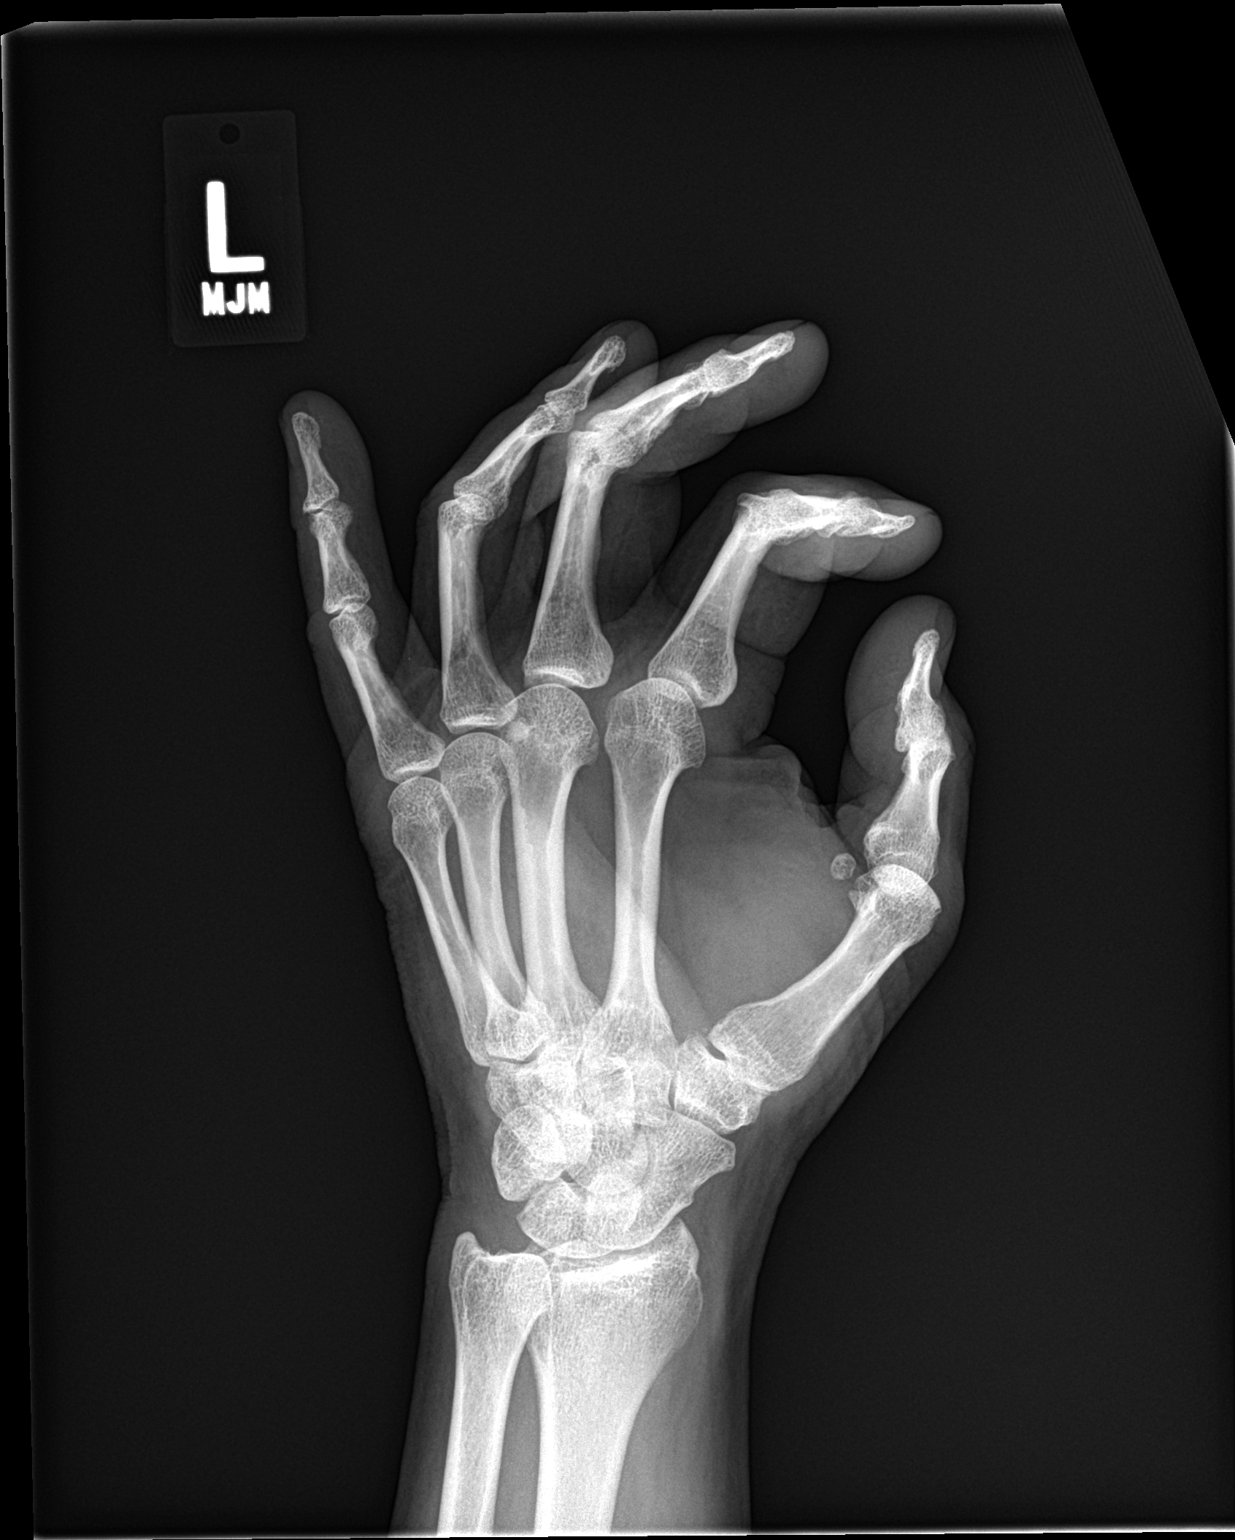

[hand lat]
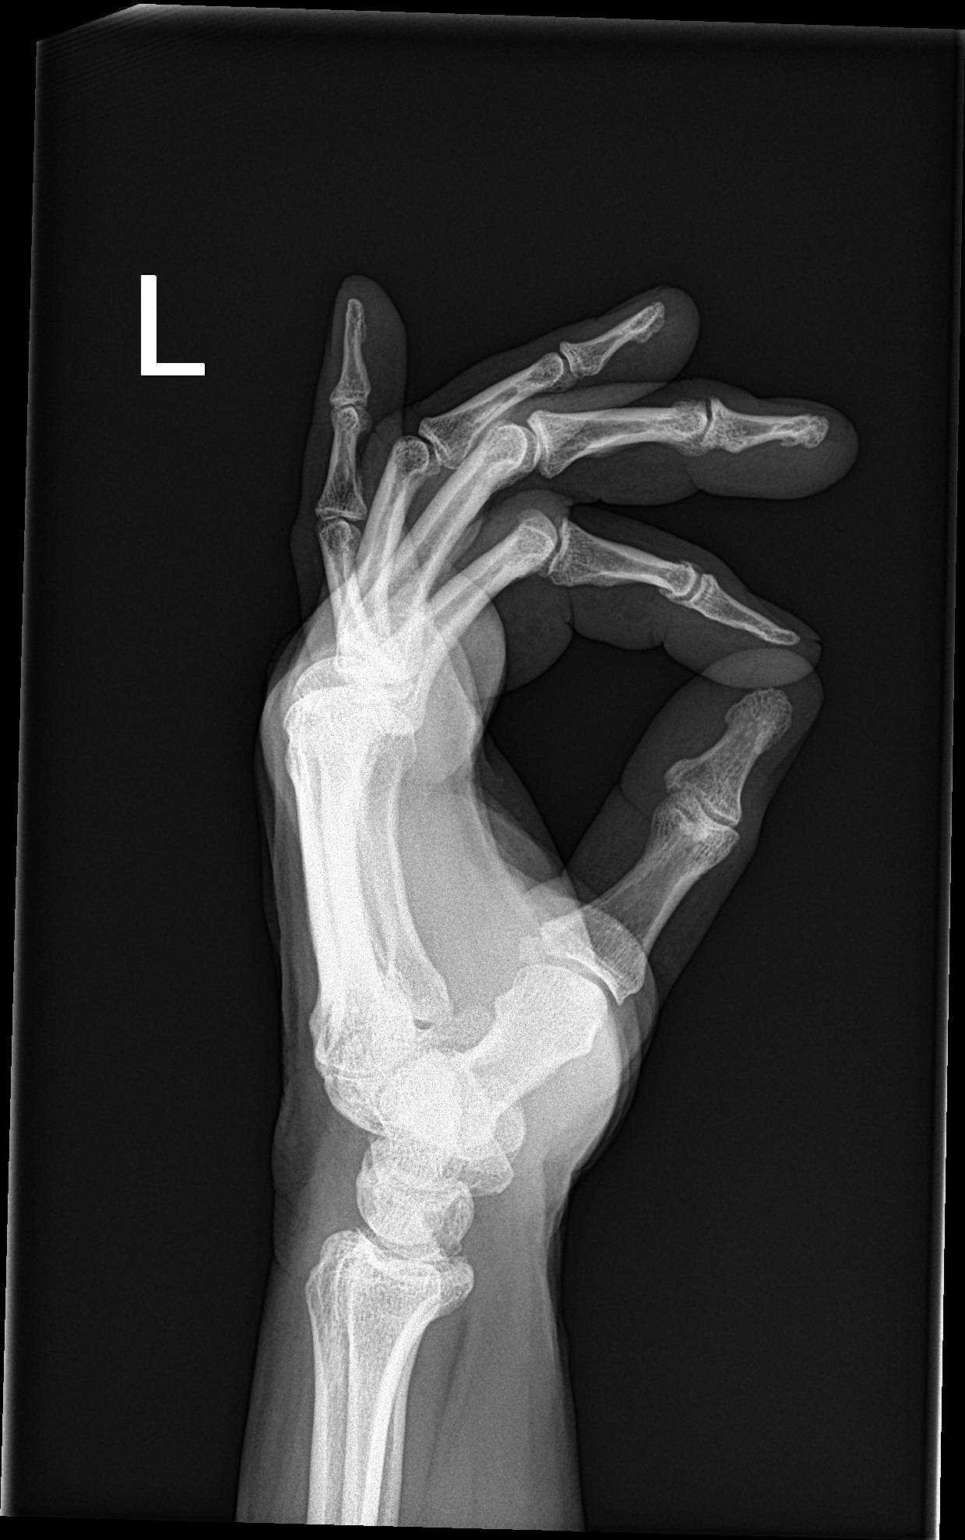

[3 of 3 positions shown; findings below may reference images not displayed]

FINDINGS: Osseous alignment is normal. Bone mineralization is normal. No
fracture line or displaced fracture fragment seen. Adjacent soft
tissues are unremarkable.
IMPRESSION: Negative.

## 2017-08-05 ENCOUNTER — Encounter: Payer: Self-pay | Admitting: Emergency Medicine

## 2017-08-05 ENCOUNTER — Ambulatory Visit: Payer: PRIVATE HEALTH INSURANCE | Admitting: Emergency Medicine

## 2017-08-05 ENCOUNTER — Ambulatory Visit: Payer: PRIVATE HEALTH INSURANCE

## 2017-08-05 VITALS — BP 149/91 | HR 64 | Temp 98.6°F | Resp 12 | Ht 72.0 in | Wt 220.0 lb

## 2017-08-05 DIAGNOSIS — S0990XA Unspecified injury of head, initial encounter: Secondary | ICD-10-CM | POA: Diagnosis not present

## 2017-08-05 DIAGNOSIS — S0181XA Laceration without foreign body of other part of head, initial encounter: Secondary | ICD-10-CM | POA: Diagnosis not present

## 2017-08-05 DIAGNOSIS — S40011A Contusion of right shoulder, initial encounter: Secondary | ICD-10-CM | POA: Diagnosis not present

## 2017-08-05 DIAGNOSIS — W19XXXA Unspecified fall, initial encounter: Secondary | ICD-10-CM

## 2017-08-05 NOTE — Progress Notes (Signed)
[name] [name] 52 y.o.   Chief Complaint  Patient presents with  . Fall    Fell this morning at 8:45am getting out of truck. Fell on right side, hit right side of head. Denies loss of conciousness, dizzniess, visual changes. Also has right shoulder pain,     HISTORY OF PRESENT ILLNESS: This is a 52 y.o. male tripped and fell while getting out of his truck this morning.  Sustained head injury with right-sided forehead laceration.  Denies LOC.  Denies visual changes, dizziness, headache, or any neurological symptoms.  Also injured right shoulder, complaining of pain but has full range of motion.  No other significant injuries or symptoms.  HPI   Prior to Admission medications   Medication Sig Start Date End Date Taking? Authorizing Provider  atorvastatin (LIPITOR) 10 MG tablet Take 10 mg by mouth daily.   Yes [provider]  sertraline (ZOLOFT) 100 MG tablet Take 100 mg by mouth daily.     Yes [provider]  tiZANidine (ZANAFLEX) 4 MG tablet Take 4 mg by mouth every 6 (six) hours as needed for muscle spasms.   Yes [provider]  naproxen (NAPROSYN) 500 MG tablet Take 1 tablet (500 mg total) by mouth 2 (two) times daily with a meal. Patient not taking: Reported on 08/05/2017 12/20/15   Ofilia Neas, PA-C    No Known Allergies  There are no active problems to display for this patient.   Past Medical History:  Diagnosis Date  . Depression with anxiety     No past surgical history on file.  Social History   Socioeconomic History  . Marital status: Married    Spouse name: Not on file  . Number of children: Not on file  . Years of education: Not on file  . Highest education level: Not on file  Occupational History  . Not on file  Social Needs  . Financial resource strain: Not on file  . Food insecurity:    Worry: Not on file    Inability: Not on file  . Transportation needs:    Medical: Not on file    Non-medical: Not on file  Tobacco  Use  . Smoking status: Former Smoker    Packs/day: 1.00    Years: 10.00    Pack years: 10.00    Types: Cigarettes  . Smokeless tobacco: Never Used  Substance and Sexual Activity  . Alcohol use: No    Alcohol/week: 0.0 oz  . Drug use: Yes    Types: Marijuana  . Sexual activity: Not on file  Lifestyle  . Physical activity:    Days per week: Not on file    Minutes per session: Not on file  . Stress: Not on file  Relationships  . Social connections:    Talks on phone: Not on file    Gets together: Not on file    Attends religious service: Not on file    Active member of club or organization: Not on file    Attends meetings of clubs or organizations: Not on file    Relationship status: Not on file  . Intimate partner violence:    Fear of current or ex partner: Not on file    Emotionally abused: Not on file    Physically abused: Not on file    Forced sexual activity: Not on file  Other Topics Concern  . Not on file  Social History Narrative  . Not on file    No  family history on file.   Review of Systems  Constitutional: Negative.  Negative for chills and fever.  HENT: Negative.  Negative for ear pain, hearing loss, nosebleeds and sore throat.   Eyes: Negative.  Negative for blurred vision and double vision.  Respiratory: Negative.  Negative for cough and shortness of breath.   Cardiovascular: Negative.  Negative for chest pain and palpitations.  Gastrointestinal: Negative.  Negative for abdominal pain, nausea and vomiting.  Musculoskeletal: Positive for joint pain (Right shoulder).  Neurological: Negative.  Negative for dizziness, sensory change, speech change, focal weakness, seizures, loss of consciousness and headaches.  All other systems reviewed and are negative.   Vitals:   08/05/17 0953  BP: (!) 149/91  Pulse: 64  Resp: 12  Temp: 98.6 F (37 C)  SpO2: 96%    Physical Exam  Constitutional: He is oriented to person, place, and time. He appears  well-developed and well-nourished.  HENT:  Head: Normocephalic.  Right Ear: External ear normal.  Left Ear: External ear normal.  Nose: Nose normal.  Mouth/Throat: Oropharynx is clear and moist.  Eyes: Pupils are equal, round, and reactive to light. Conjunctivae and EOM are normal.  Neck: Normal range of motion. Neck supple.  Cardiovascular: Normal rate and regular rhythm.  Pulmonary/Chest: Effort normal and breath sounds normal.  Musculoskeletal: Normal range of motion.  Right shoulder.  Mild tenderness to palpation, no ecchymosis, full range of motion.  No dislocation.  No clavicular tenderness.  No scapular tenderness.  Neurological: He is alert and oriented to person, place, and time. He displays normal reflexes. No cranial nerve deficit or sensory deficit. He exhibits normal muscle tone. Coordination normal.  Skin:  Right forehead: Jagged laceration about 3 cm long, vertical, with surrounding soft tissue swelling and abrasion.  Clean looking.  No debris or dirt visible.  Vitals reviewed.  Verbal consent obtained. Area cleansed with Betadine and infiltrated with Lidocaine. Explored and irrigated with copious amounts of NSS. No FB seen or felt. Closed with 5-5-0 Prolene. No complications. Pt tolerated procedure well.   ASSESSMENT & PLAN: Idrees was seen today for fall.  Diagnoses and all orders for this visit:  Acute head injury, initial encounter  Laceration of forehead, initial encounter  Contusion of right shoulder, initial encounter  Accidental fall, initial encounter    Patient Instructions       IF you received an x-ray today, you will receive an invoice from [facility]. Please contact [facility] at [phone] with questions or concerns regarding your invoice.   IF you received labwork today, you will receive an invoice from [place]. Please contact [facility] at [phone] with questions or concerns regarding your invoice.   Our billing  staff will not be able to assist you with questions regarding bills from these companies.  You will be contacted with the lab results as soon as they are available. The fastest way to get your results is to activate your My Chart account. Instructions are located on the last page of this paperwork. If you have not heard from [place] regarding the results in 2 weeks, please contact this office.      Contusion A contusion is a deep bruise. Contusions happen when an injury causes bleeding under the skin. Symptoms of bruising include pain, swelling, and discolored skin. The skin may turn blue, purple, or yellow. Follow these instructions at home:  Rest the injured area.  If told, put ice on the injured area. ? Put ice in a plastic bag. ?  Place a towel between your skin and the bag. ? Leave the ice on for 20 minutes, 2-3 times per day.  If told, put light pressure (compression) on the injured area using an elastic bandage. Make sure the bandage is not too tight. Remove it and put it back on as told by your doctor.  If possible, raise (elevate) the injured area above the level of your heart while you are sitting or lying down.  Take over-the-counter and prescription medicines only as told by your doctor. Contact a doctor if:  Your symptoms do not get better after several days of treatment.  Your symptoms get worse.  You have trouble moving the injured area. Get help right away if:  You have very bad pain.  You have a loss of feeling (numbness) in a hand or foot.  Your hand or foot turns pale or cold. This information is not intended to replace advice given to you by your health care provider. Make sure you discuss any questions you have with your health care provider. Document Released: 10/08/2007 Document Revised: 09/27/2015 Document Reviewed: 09/06/2014 Elsevier Interactive Patient Education  2018 [employer] Inc[employer]  Laceration Care, Adult A laceration is a cut that goes through all  layers of the skin. The cut also goes into the tissue that is right under the skin. Some cuts heal on their own. Others need to be closed with stitches (sutures), staples, skin adhesive strips, or wound glue. Taking care of your cut lowers your risk of infection and helps your cut to heal better. How to take care of your cut For stitches or staples:  Keep the wound clean and dry.  If you were given a bandage (dressing), you should change it at least one time per day or as told by your doctor. You should also change it if it gets wet or dirty.  Keep the wound completely dry for the first 24 hours or as told by your doctor. After that time, you may take a shower or a bath. However, make sure that the wound is not soaked in water until after the stitches or staples have been removed.  Clean the wound one time each day or as told by your doctor: ? Wash the wound with soap and water. ? Rinse the wound with water until all of the soap comes off. ? Pat the wound dry with a clean towel. Do not rub the wound.  After you clean the wound, put a thin layer of antibiotic ointment on it as told by your doctor. This ointment: ? Helps to prevent infection. ? Keeps the bandage from sticking to the wound.  Have your stitches or staples removed as told by your doctor. If your doctor used skin adhesive strips:  Keep the wound clean and dry.  If you were given a bandage, you should change it at least one time per day or as told by your doctor. You should also change it if it gets dirty or wet.  Do not get the skin adhesive strips wet. You can take a shower or a bath, but be careful to keep the wound dry.  If the wound gets wet, pat it dry with a clean towel. Do not rub the wound.  Skin adhesive strips fall off on their own. You can trim the strips as the wound heals. Do not remove any strips that are still stuck to the wound. They will fall off after a while. If your doctor used wound glue:  Try  to keep  your wound dry, but you may briefly wet it in the shower or bath. Do not soak the wound in water, such as by swimming.  After you take a shower or a bath, gently pat the wound dry with a clean towel. Do not rub the wound.  Do not do any activities that will make you really sweaty until the skin glue has fallen off on its own.  Do not apply liquid, cream, or ointment medicine to your wound while the skin glue is still on.  If you were given a bandage, you should change it at least one time per day or as told by your doctor. You should also change it if it gets dirty or wet.  If a bandage is placed over the wound, do not let the tape for the bandage touch the skin glue.  Do not pick at the glue. The skin glue usually stays on for 5-10 days. Then, it falls off of the skin. General Instructions  To help prevent scarring, make sure to cover your wound with sunscreen whenever you are outside after stitches are removed, after adhesive strips are removed, or when wound glue stays in place and the wound is healed. Make sure to wear a sunscreen of at least 30 SPF.  Take over-the-counter and prescription medicines only as told by your doctor.  If you were given antibiotic medicine or ointment, take or apply it as told by your doctor. Do not stop using the antibiotic even if your wound is getting better.  Do not scratch or pick at the wound.  Keep all follow-up visits as told by your doctor. This is important.  Check your wound every day for signs of infection. Watch for: ? Redness, swelling, or pain. ? Fluid, blood, or pus.  Raise (elevate) the injured area above the level of your heart while you are sitting or lying down, if possible. Get help if:  You got a tetanus shot and you have any of these problems at the injection site: ? Swelling. ? Very bad pain. ? Redness. ? Bleeding.  You have a fever.  A wound that was closed breaks open.  You notice a bad smell coming from your wound or  your bandage.  You notice something coming out of the wound, such as wood or glass.  Medicine does not help your pain.  You have more redness, swelling, or pain at the site of your wound.  You have fluid, blood, or pus coming from your wound.  You notice a change in the color of your skin near your wound.  You need to change the bandage often because fluid, blood, or pus is coming from the wound.  You start to have a new rash.  You start to have numbness around the wound. Get help right away if:  You have very bad swelling around the wound.  Your pain suddenly gets worse and is very bad.  You notice painful lumps near the wound or on skin that is anywhere on your body.  You have a red streak going away from your wound.  The wound is on your hand or foot and you cannot move a finger or toe like you usually can.  The wound is on your hand or foot and you notice that your fingers or toes look pale or bluish. This information is not intended to replace advice given to you by your health care provider. Make sure you discuss any questions you have with  your health care provider. Document Released: 10/08/2007 Document Revised: 09/27/2015 Document Reviewed: 04/17/2014 Elsevier Interactive Patient Education  2018 [employer] Inc.  Head Injury, Adult There are many types of head injuries. They can be as minor as a bump. Some head injuries can be worse. Worse injuries include:  A strong hit to the head that hurts the brain (concussion).  A bruise of the brain (contusion). This means there is bleeding in the brain that can cause swelling.  A cracked skull (skull fracture).  Bleeding in the brain that gathers, gets thick (makes a clot), and forms a bump (hematoma).  Most problems from a head injury come in the first 24 hours. However, you may still have side effects up to 7-10 days after your injury. It is important to watch your condition for any changes. Follow these instructions at  home: Activity  Rest as much as possible.  Avoid activities that are hard or tiring.  Make sure you get enough sleep.  Limit activities that need a lot of thought or attention, such as: ? Watching TV. ? Playing memory games and puzzles. ? Job-related work or homework. ? Working on [employer], and texting.  Avoid activities that could cause another head injury until your doctor says it is okay. This includes playing sports.  Ask your doctor when it is safe for you to go back to your normal activities, such as work or school. Ask your doctor for a step-by-step plan for slowly going back to your normal activities.  Ask your doctor when you can drive, ride a bicycle, or use heavy machinery. Never do these activities if you are dizzy. Lifestyle  Do not drink alcohol until your doctor says it is okay.  Avoid drug use.  If it is harder than usual to remember things, write them down.  If you are easily distracted, try to do one thing at a time.  Talk with family members or close friends when making important decisions.  Tell your friends, family, a trusted coworker, and work Production designer, theatre/television/film about your injury, symptoms, and limits (restrictions). Have them watch for any problems that are new or getting worse. General instructions  Take over-the-counter and prescription medicines only as told by your doctor.  Have someone stay with you for 24 hours after your head injury. This person should watch you for any changes in your symptoms and be ready to get help.  Keep all follow-up visits as told by your doctor. This is important. How is this prevented?  Work on Therapist, music. This can help you avoid falls.  Wear a seatbelt when you are in a moving vehicle.  Wear a helmet when: ? Riding a bicycle. ? Skiing. ? Doing any other sport or activity that has a risk of injury.  Drink alcohol only in moderation.  Make your home safer by: ? Getting rid of clutter  from the floors and stairs, like things that can make you trip. ? Using grab bars in bathrooms and handrails by stairs. ? Placing non-slip mats on floors and in bathtubs. ? Putting more light in dim areas. Get help right away if:  You have: ? A very bad (severe) headache that is not helped by medicine. ? Trouble walking or weakness in your arms and legs. ? Clear or bloody fluid coming from your nose or ears. ? Changes in your seeing (vision). ? Jerky movements that you cannot control (seizure).  You throw up (vomit).  Your symptoms get worse.  You lose balance.  Your speech is slurred.  You pass out.  You are sleepier and have trouble staying awake.  The black centers of your eyes (pupils) change in size. These symptoms may be an emergency. Do not wait to see if the symptoms will go away. Get medical help right away. Call your local emergency services (911 in the [place] Do not drive yourself to the hospital. This information is not intended to replace advice given to you by your health care provider. Make sure you discuss any questions you have with your health care provider. Document Released: 04/03/2008 Document Revised: 08/15/2016 Document Reviewed: 10/30/2015 Elsevier Interactive Patient Education  2018 Elsevier Inc.      Edwina BarthMiguel Annaly Skop, MD [facility]

## 2017-08-05 NOTE — Patient Instructions (Addendum)
IF you received an x-ray today, you will receive an invoice from [facility]. Please contact [facility] at [phone] with questions or concerns regarding your invoice.   IF you received labwork today, you will receive an invoice from [facility]. Please contact [facility] at [phone] with questions or concerns regarding your invoice.   Our billing staff will not be able to assist you with questions regarding bills from these companies.  You will be contacted with the lab results as soon as they are available. The fastest way to get your results is to activate your My Chart account. Instructions are located on the last page of this paperwork. If you have not heard from [place] regarding the results in 2 weeks, please contact this office.      Contusion A contusion is a deep bruise. Contusions happen when an injury causes bleeding under the skin. Symptoms of bruising include pain, swelling, and discolored skin. The skin may turn blue, purple, or yellow. Follow these instructions at home:  Rest the injured area.  If told, put ice on the injured area. ? Put ice in a plastic bag. ? Place a towel between your skin and the bag. ? Leave the ice on for 20 minutes, 2-3 times per day.  If told, put light pressure (compression) on the injured area using an elastic bandage. Make sure the bandage is not too tight. Remove it and put it back on as told by your doctor.  If possible, raise (elevate) the injured area above the level of your heart while you are sitting or lying down.  Take over-the-counter and prescription medicines only as told by your doctor. Contact a doctor if:  Your symptoms do not get better after several days of treatment.  Your symptoms get worse.  You have trouble moving the injured area. Get help right away if:  You have very bad pain.  You have a loss of feeling (numbness) in a hand or foot.  Your hand or foot turns pale or cold. This  information is not intended to replace advice given to you by your health care provider. Make sure you discuss any questions you have with your health care provider. Document Released: 10/08/2007 Document Revised: 09/27/2015 Document Reviewed: 09/06/2014 Elsevier Interactive Patient Education  2018 Elsevier Inc.  Laceration Care, Adult A laceration is a cut that goes through all layers of the skin. The cut also goes into the tissue that is right under the skin. Some cuts heal on their own. Others need to be closed with stitches (sutures), staples, skin adhesive strips, or wound glue. Taking care of your cut lowers your risk of infection and helps your cut to heal better. How to take care of your cut For stitches or staples:  Keep the wound clean and dry.  If you were given a bandage (dressing), you should change it at least one time per day or as told by your doctor. You should also change it if it gets wet or dirty.  Keep the wound completely dry for the first 24 hours or as told by your doctor. After that time, you may take a shower or a bath. However, make sure that the wound is not soaked in water until after the stitches or staples have been removed.  Clean the wound one time each day or as told by your doctor: ? Wash the wound with soap and water. ? Rinse the wound with water until all of the soap comes off. ?  Pat the wound dry with a clean towel. Do not rub the wound.  After you clean the wound, put a thin layer of antibiotic ointment on it as told by your doctor. This ointment: ? Helps to prevent infection. ? Keeps the bandage from sticking to the wound.  Have your stitches or staples removed as told by your doctor. If your doctor used skin adhesive strips:  Keep the wound clean and dry.  If you were given a bandage, you should change it at least one time per day or as told by your doctor. You should also change it if it gets dirty or wet.  Do not get the skin adhesive strips  wet. You can take a shower or a bath, but be careful to keep the wound dry.  If the wound gets wet, pat it dry with a clean towel. Do not rub the wound.  Skin adhesive strips fall off on their own. You can trim the strips as the wound heals. Do not remove any strips that are still stuck to the wound. They will fall off after a while. If your doctor used wound glue:  Try to keep your wound dry, but you may briefly wet it in the shower or bath. Do not soak the wound in water, such as by swimming.  After you take a shower or a bath, gently pat the wound dry with a clean towel. Do not rub the wound.  Do not do any activities that will make you really sweaty until the skin glue has fallen off on its own.  Do not apply liquid, cream, or ointment medicine to your wound while the skin glue is still on.  If you were given a bandage, you should change it at least one time per day or as told by your doctor. You should also change it if it gets dirty or wet.  If a bandage is placed over the wound, do not let the tape for the bandage touch the skin glue.  Do not pick at the glue. The skin glue usually stays on for 5-10 days. Then, it falls off of the skin. General Instructions  To help prevent scarring, make sure to cover your wound with sunscreen whenever you are outside after stitches are removed, after adhesive strips are removed, or when wound glue stays in place and the wound is healed. Make sure to wear a sunscreen of at least 30 SPF.  Take over-the-counter and prescription medicines only as told by your doctor.  If you were given antibiotic medicine or ointment, take or apply it as told by your doctor. Do not stop using the antibiotic even if your wound is getting better.  Do not scratch or pick at the wound.  Keep all follow-up visits as told by your doctor. This is important.  Check your wound every day for signs of infection. Watch for: ? Redness, swelling, or pain. ? Fluid, blood, or  pus.  Raise (elevate) the injured area above the level of your heart while you are sitting or lying down, if possible. Get help if:  You got a tetanus shot and you have any of these problems at the injection site: ? Swelling. ? Very bad pain. ? Redness. ? Bleeding.  You have a fever.  A wound that was closed breaks open.  You notice a bad smell coming from your wound or your bandage.  You notice something coming out of the wound, such as wood or glass.  Medicine does  not help your pain.  You have more redness, swelling, or pain at the site of your wound.  You have fluid, blood, or pus coming from your wound.  You notice a change in the color of your skin near your wound.  You need to change the bandage often because fluid, blood, or pus is coming from the wound.  You start to have a new rash.  You start to have numbness around the wound. Get help right away if:  You have very bad swelling around the wound.  Your pain suddenly gets worse and is very bad.  You notice painful lumps near the wound or on skin that is anywhere on your body.  You have a red streak going away from your wound.  The wound is on your hand or foot and you cannot move a finger or toe like you usually can.  The wound is on your hand or foot and you notice that your fingers or toes look pale or bluish. This information is not intended to replace advice given to you by your health care provider. Make sure you discuss any questions you have with your health care provider. Document Released: 10/08/2007 Document Revised: 09/27/2015 Document Reviewed: 04/17/2014 Elsevier Interactive Patient Education  2018 Elsevier Inc.  Head Injury, Adult There are many types of head injuries. They can be as minor as a bump. Some head injuries can be worse. Worse injuries include:  A strong hit to the head that hurts the brain (concussion).  A bruise of the brain (contusion). This means there is bleeding in the  brain that can cause swelling.  A cracked skull (skull fracture).  Bleeding in the brain that gathers, gets thick (makes a clot), and forms a bump (hematoma).  Most problems from a head injury come in the first 24 hours. However, you may still have side effects up to 7-10 days after your injury. It is important to watch your condition for any changes. Follow these instructions at home: Activity  Rest as much as possible.  Avoid activities that are hard or tiring.  Make sure you get enough sleep.  Limit activities that need a lot of thought or attention, such as: ? Watching TV. ? Playing memory games and puzzles. ? Job-related work or homework. ? Working on [employer], and texting.  Avoid activities that could cause another head injury until your doctor says it is okay. This includes playing sports.  Ask your doctor when it is safe for you to go back to your normal activities, such as work or school. Ask your doctor for a step-by-step plan for slowly going back to your normal activities.  Ask your doctor when you can drive, ride a bicycle, or use heavy machinery. Never do these activities if you are dizzy. Lifestyle  Do not drink alcohol until your doctor says it is okay.  Avoid drug use.  If it is harder than usual to remember things, write them down.  If you are easily distracted, try to do one thing at a time.  Talk with family members or close friends when making important decisions.  Tell your friends, family, a trusted coworker, and work Production designer, theatre/television/film about your injury, symptoms, and limits (restrictions). Have them watch for any problems that are new or getting worse. General instructions  Take over-the-counter and prescription medicines only as told by your doctor.  Have someone stay with you for 24 hours after your head injury. This person should watch you for  any changes in your symptoms and be ready to get help.  Keep all follow-up visits as told by  your doctor. This is important. How is this prevented?  Work on Therapist, music. This can help you avoid falls.  Wear a seatbelt when you are in a moving vehicle.  Wear a helmet when: ? Riding a bicycle. ? Skiing. ? Doing any other sport or activity that has a risk of injury.  Drink alcohol only in moderation.  Make your home safer by: ? Getting rid of clutter from the floors and stairs, like things that can make you trip. ? Using grab bars in bathrooms and handrails by stairs. ? Placing non-slip mats on floors and in bathtubs. ? Putting more light in dim areas. Get help right away if:  You have: ? A very bad (severe) headache that is not helped by medicine. ? Trouble walking or weakness in your arms and legs. ? Clear or bloody fluid coming from your nose or ears. ? Changes in your seeing (vision). ? Jerky movements that you cannot control (seizure).  You throw up (vomit).  Your symptoms get worse.  You lose balance.  Your speech is slurred.  You pass out.  You are sleepier and have trouble staying awake.  The black centers of your eyes (pupils) change in size. These symptoms may be an emergency. Do not wait to see if the symptoms will go away. Get medical help right away. Call your local emergency services (911 in the [place] Do not drive yourself to the hospital. This information is not intended to replace advice given to you by your health care provider. Make sure you discuss any questions you have with your health care provider. Document Released: 04/03/2008 Document Revised: 08/15/2016 Document Reviewed: 10/30/2015 Elsevier Interactive Patient Education  [employer].

## 2017-08-12 ENCOUNTER — Ambulatory Visit: Payer: PRIVATE HEALTH INSURANCE | Admitting: Emergency Medicine

## 2017-08-12 ENCOUNTER — Other Ambulatory Visit: Payer: Self-pay

## 2017-08-12 ENCOUNTER — Encounter: Payer: Self-pay | Admitting: Emergency Medicine

## 2017-08-12 VITALS — BP 108/70 | HR 64 | Temp 98.1°F | Resp 16 | Ht 72.0 in | Wt 229.4 lb

## 2017-08-12 DIAGNOSIS — Z4802 Encounter for removal of sutures: Secondary | ICD-10-CM

## 2017-08-12 DIAGNOSIS — S0181XD Laceration without foreign body of other part of head, subsequent encounter: Secondary | ICD-10-CM | POA: Diagnosis not present

## 2017-08-12 NOTE — Progress Notes (Signed)
[name] [name] 52 y.o.   Chief Complaint  Patient presents with  . Suture / Staple Removal    forehead    HISTORY OF PRESENT ILLNESS: This is a 52 y.o. male here for suture removal.  Forehead laceration repaired by me on 08/05/2017.  Doing well.  No complaints.  No neurological signs or symptoms.  HPI   Prior to Admission medications   Medication Sig Start Date End Date Taking? Authorizing Provider  atorvastatin (LIPITOR) 10 MG tablet Take 10 mg by mouth daily.   Yes [provider]  sertraline (ZOLOFT) 100 MG tablet Take 100 mg by mouth daily.     Yes [provider]  tiZANidine (ZANAFLEX) 4 MG tablet Take 4 mg by mouth every 6 (six) hours as needed for muscle spasms.   Yes [provider]  naproxen (NAPROSYN) 500 MG tablet Take 1 tablet (500 mg total) by mouth 2 (two) times daily with a meal. Patient not taking: Reported on 08/05/2017 12/20/15   Ofilia Neas, PA-C    No Known Allergies  Patient Active Problem List   Diagnosis Date Noted  . Acute head injury 08/05/2017  . Laceration of forehead 08/05/2017  . Accidental fall 08/05/2017  . Contusion of right shoulder 08/05/2017    Past Medical History:  Diagnosis Date  . Depression with anxiety     No past surgical history on file.  Social History   Socioeconomic History  . Marital status: Married    Spouse name: Not on file  . Number of children: Not on file  . Years of education: Not on file  . Highest education level: Not on file  Occupational History  . Not on file  Social Needs  . Financial resource strain: Not on file  . Food insecurity:    Worry: Not on file    Inability: Not on file  . Transportation needs:    Medical: Not on file    Non-medical: Not on file  Tobacco Use  . Smoking status: Former Smoker    Packs/day: 1.00    Years: 10.00    Pack years: 10.00    Types: Cigarettes  . Smokeless tobacco: Never Used  Substance and Sexual Activity  . Alcohol use: No   Alcohol/week: 0.0 oz  . Drug use: Yes    Types: Marijuana  . Sexual activity: Not on file  Lifestyle  . Physical activity:    Days per week: Not on file    Minutes per session: Not on file  . Stress: Not on file  Relationships  . Social connections:    Talks on phone: Not on file    Gets together: Not on file    Attends religious service: Not on file    Active member of club or organization: Not on file    Attends meetings of clubs or organizations: Not on file    Relationship status: Not on file  . Intimate partner violence:    Fear of current or ex partner: Not on file    Emotionally abused: Not on file    Physically abused: Not on file    Forced sexual activity: Not on file  Other Topics Concern  . Not on file  Social History Narrative  . Not on file    No family history on file.   Review of Systems  Constitutional: Negative for chills and fever.  HENT: Negative for congestion and nosebleeds.   Eyes: Negative for blurred vision and double vision.  Gastrointestinal: Negative for nausea and vomiting.  Neurological: Negative for dizziness, sensory change, focal weakness and headaches.  All other systems reviewed and are negative.    Physical Exam  Constitutional: He is oriented to person, place, and time. He appears well-developed and well-nourished.  HENT:  Head: Normocephalic.  Eyes: Pupils are equal, round, and reactive to light. EOM are normal.  Neck: Normal range of motion.  Cardiovascular: Normal rate.  Pulmonary/Chest: Effort normal.  Musculoskeletal: Normal range of motion.  Neurological: He is alert and oriented to person, place, and time. No sensory deficit. He exhibits normal muscle tone.  Skin: Skin is warm and dry.  Right forehead laceration: Sutures in place.  Well-healed laceration scabbed over.  No infection.  Psychiatric: He has a normal mood and affect. His behavior is normal.  Vitals reviewed.  Patient presents for suture removal. The wound  is well healed without signs of infection.  The sutures are removed. Wound care and activity instructions given. Return prn.   ASSESSMENT & PLAN: [name] [name] was seen today for suture / staple removal.  Diagnoses and all orders for this visit:  Laceration of forehead, subsequent encounter  Encounter for removal of sutures   Patient Instructions       IF you received an x-ray today, you will receive an invoice from [facility]. Please contact [facility] at [phone] with questions or concerns regarding your invoice.   IF you received labwork today, you will receive an invoice from [facility]. Please contact [facility] at [phone] with questions or concerns regarding your invoice.   Our billing staff will not be able to assist you with questions regarding bills from these companies.  You will be contacted with the lab results as soon as they are available. The fastest way to get your results is to activate your My Chart account. Instructions are located on the last page of this paperwork. If you have not heard from [place] regarding the results in 2 weeks, please contact this office.    Suture Removal, Care After Refer to this sheet in the next few weeks. These instructions provide you with information on caring for yourself after your procedure. Your health care provider may also give you more specific instructions. Your treatment has been planned according to current medical practices, but problems sometimes occur. Call your health care provider if you have any problems or questions after your procedure. What can I expect after the procedure? After your stitches (sutures) are removed, it is typical to have the following:  Some discomfort and swelling in the wound area.  Slight redness in the area.  Follow these instructions at home:  If you have skin adhesive strips over the wound area, do not take the strips off. They will fall off on their own in a few days. If  the strips remain in place after 14 days, you may remove them.  Change any bandages (dressings) at least once a day or as directed by your health care provider. If the bandage sticks, soak it off with warm, soapy water.  Apply cream or ointment only as directed by your health care provider. If using cream or ointment, wash the area with soap and water 2 times a day to remove all the cream or ointment. Rinse off the soap and pat the area dry with a clean towel.  Keep the wound area dry and clean. If the bandage becomes wet or dirty, or if it develops a bad smell, change it as soon as possible.  Continue to protect the wound from injury.  Use sunscreen when out in the sun. New scars become sunburned easily. Contact a health care provider if:  You have increasing redness, swelling, or pain in the wound.  You see pus coming from the wound.  You have a fever.  You notice a bad smell coming from the wound or dressing.  Your wound breaks open (edges not staying together). This information is not intended to replace advice given to you by your health care provider. Make sure you discuss any questions you have with your health care provider. Document Released: 01/14/2001 Document Revised: 09/27/2015 Document Reviewed: 12/01/2012 Elsevier Interactive Patient Education  2017 Elsevier Inc.      Edwina BarthMiguel Aliha Diedrich, MD [facility]

## 2017-08-12 NOTE — Patient Instructions (Addendum)
     IF you received an x-ray today, you will receive an invoice from [facility]. Please contact [facility] at [phone] with questions or concerns regarding your invoice.   IF you received labwork today, you will receive an invoice from [facility]. Please contact [facility] at [phone] with questions or concerns regarding your invoice.   Our billing staff will not be able to assist you with questions regarding bills from these companies.  You will be contacted with the lab results as soon as they are available. The fastest way to get your results is to activate your My Chart account. Instructions are located on the last page of this paperwork. If you have not heard from us regarding the results in 2 weeks, please contact this office.    Suture Removal, Care After Refer to this sheet in the next few weeks. These instructions provide you with information on caring for yourself after your procedure. Your health care provider may also give you more specific instructions. Your treatment has been planned according to current medical practices, but problems sometimes occur. Call your health care provider if you have any problems or questions after your procedure. What can I expect after the procedure? After your stitches (sutures) are removed, it is typical to have the following:  Some discomfort and swelling in the wound area.  Slight redness in the area.  Follow these instructions at home:  If you have skin adhesive strips over the wound area, do not take the strips off. They will fall off on their own in a few days. If the strips remain in place after 14 days, you may remove them.  Change any bandages (dressings) at least once a day or as directed by your health care provider. If the bandage sticks, soak it off with warm, soapy water.  Apply cream or ointment only as directed by your health care provider. If using cream or ointment, wash the area with soap and water 2  times a day to remove all the cream or ointment. Rinse off the soap and pat the area dry with a clean towel.  Keep the wound area dry and clean. If the bandage becomes wet or dirty, or if it develops a bad smell, change it as soon as possible.  Continue to protect the wound from injury.  Use sunscreen when out in the sun. New scars become sunburned easily. Contact a health care provider if:  You have increasing redness, swelling, or pain in the wound.  You see pus coming from the wound.  You have a fever.  You notice a bad smell coming from the wound or dressing.  Your wound breaks open (edges not staying together). This information is not intended to replace advice given to you by your health care provider. Make sure you discuss any questions you have with your health care provider. Document Released: 01/14/2001 Document Revised: 09/27/2015 Document Reviewed: 12/01/2012 Elsevier Interactive Patient Education  2017 Elsevier Inc.  

## 2020-08-09 ENCOUNTER — Other Ambulatory Visit: Payer: Self-pay | Admitting: *Deleted

## 2020-08-09 DIAGNOSIS — Z87891 Personal history of nicotine dependence: Secondary | ICD-10-CM

## 2020-09-10 ENCOUNTER — Encounter: Payer: BC Managed Care – PPO | Admitting: Acute Care

## 2020-09-10 ENCOUNTER — Inpatient Hospital Stay: Admission: RE | Admit: 2020-09-10 | Payer: BC Managed Care – PPO | Source: Ambulatory Visit

## 2021-07-12 DIAGNOSIS — Z0000 Encounter for general adult medical examination without abnormal findings: Secondary | ICD-10-CM | POA: Diagnosis not present

## 2021-08-30 DIAGNOSIS — U071 COVID-19: Secondary | ICD-10-CM | POA: Diagnosis not present

## 2022-07-16 DIAGNOSIS — M62838 Other muscle spasm: Secondary | ICD-10-CM | POA: Diagnosis not present

## 2022-07-16 DIAGNOSIS — Z23 Encounter for immunization: Secondary | ICD-10-CM | POA: Diagnosis not present

## 2022-07-16 DIAGNOSIS — Z0000 Encounter for general adult medical examination without abnormal findings: Secondary | ICD-10-CM | POA: Diagnosis not present

## 2022-07-16 DIAGNOSIS — F419 Anxiety disorder, unspecified: Secondary | ICD-10-CM | POA: Diagnosis not present

## 2022-07-16 DIAGNOSIS — E7800 Pure hypercholesterolemia, unspecified: Secondary | ICD-10-CM | POA: Diagnosis not present

## 2022-07-16 DIAGNOSIS — R7303 Prediabetes: Secondary | ICD-10-CM | POA: Diagnosis not present

## 2022-07-16 DIAGNOSIS — Z125 Encounter for screening for malignant neoplasm of prostate: Secondary | ICD-10-CM | POA: Diagnosis not present

## 2022-08-14 DIAGNOSIS — Z1211 Encounter for screening for malignant neoplasm of colon: Secondary | ICD-10-CM | POA: Diagnosis not present

## 2022-08-19 DIAGNOSIS — R7401 Elevation of levels of liver transaminase levels: Secondary | ICD-10-CM | POA: Diagnosis not present

## 2022-08-28 ENCOUNTER — Other Ambulatory Visit: Payer: Self-pay | Admitting: Family Medicine

## 2022-08-28 DIAGNOSIS — R7401 Elevation of levels of liver transaminase levels: Secondary | ICD-10-CM

## 2022-09-03 ENCOUNTER — Other Ambulatory Visit: Payer: Self-pay | Admitting: Family Medicine

## 2022-09-03 DIAGNOSIS — R7401 Elevation of levels of liver transaminase levels: Secondary | ICD-10-CM

## 2022-09-17 DIAGNOSIS — Z23 Encounter for immunization: Secondary | ICD-10-CM | POA: Diagnosis not present

## 2022-10-03 ENCOUNTER — Ambulatory Visit
Admission: RE | Admit: 2022-10-03 | Discharge: 2022-10-03 | Disposition: A | Discharge status: Home / Self Care | Payer: BC Managed Care – PPO | Source: Ambulatory Visit | Attending: Family Medicine | Admitting: Family Medicine

## 2022-10-03 DIAGNOSIS — R7401 Elevation of levels of liver transaminase levels: Secondary | ICD-10-CM

## 2022-10-03 DIAGNOSIS — K769 Liver disease, unspecified: Secondary | ICD-10-CM | POA: Diagnosis not present

## 2022-10-03 DIAGNOSIS — K8020 Calculus of gallbladder without cholecystitis without obstruction: Secondary | ICD-10-CM | POA: Diagnosis not present

## 2022-12-17 ENCOUNTER — Other Ambulatory Visit: Payer: Self-pay | Admitting: Internal Medicine

## 2022-12-17 DIAGNOSIS — R7401 Elevation of levels of liver transaminase levels: Secondary | ICD-10-CM | POA: Diagnosis not present

## 2022-12-17 DIAGNOSIS — K8020 Calculus of gallbladder without cholecystitis without obstruction: Secondary | ICD-10-CM | POA: Diagnosis not present

## 2022-12-25 ENCOUNTER — Ambulatory Visit
Admission: RE | Admit: 2022-12-25 | Discharge: 2022-12-25 | Disposition: A | Discharge status: Home / Self Care | Payer: BC Managed Care – PPO | Source: Ambulatory Visit | Attending: Internal Medicine | Admitting: Internal Medicine

## 2022-12-25 DIAGNOSIS — R7401 Elevation of levels of liver transaminase levels: Secondary | ICD-10-CM

## 2023-01-01 DIAGNOSIS — K8020 Calculus of gallbladder without cholecystitis without obstruction: Secondary | ICD-10-CM | POA: Diagnosis not present

## 2023-01-01 DIAGNOSIS — K7460 Unspecified cirrhosis of liver: Secondary | ICD-10-CM | POA: Diagnosis not present

## 2023-01-01 DIAGNOSIS — N281 Cyst of kidney, acquired: Secondary | ICD-10-CM | POA: Diagnosis not present

## 2023-01-19 DIAGNOSIS — K766 Portal hypertension: Secondary | ICD-10-CM | POA: Diagnosis not present

## 2023-01-19 DIAGNOSIS — R7303 Prediabetes: Secondary | ICD-10-CM | POA: Diagnosis not present

## 2023-01-22 ENCOUNTER — Encounter (HOSPITAL_COMMUNITY): Payer: Self-pay | Admitting: Internal Medicine

## 2023-01-22 ENCOUNTER — Other Ambulatory Visit: Payer: Self-pay

## 2023-01-22 ENCOUNTER — Other Ambulatory Visit: Payer: Self-pay | Admitting: Internal Medicine

## 2023-01-22 NOTE — Progress Notes (Signed)
PCP - [place], [place]  [facility] Cardiologist -   PPM/ICD -  Device Orders -  Rep Notified -   Chest x-ray -  EKG -  Stress Test -  ECHO -  Cardiac Cath -   Sleep Study -  CPAP -   Fasting Blood Sugar -  Checks Blood Sugar _____ times a day  Last dose of GLP1 agonist-   GLP1 instructions:   Blood Thinner Instructions: Aspirin Instructions:  ERAS Protcol - PRE-SURGERY Ensure or G2-     Activity- able to climb stairs with no SOB or CP Anesthesia review: no  Patient denies shortness of breath, fever, cough and chest pain at PAT appointment   All instructions explained to the patient, with a verbal understanding of the material. Patient agrees to go over the instructions while at home for a better understanding. Patient also instructed to self quarantine after being tested for COVID-19. The opportunity to ask questions was provided.

## 2023-01-26 NOTE — Anesthesia Preprocedure Evaluation (Signed)
Anesthesia Evaluation  Patient identified by MRN, date of birth, ID band Patient awake    Reviewed: Allergy & Precautions, NPO status , Patient's Chart, lab work & pertinent test results  Airway Mallampati: II  TM Distance: >3 FB Neck ROM: Full    Dental no notable dental hx. (+) Teeth Intact, Dental Advisory Given   Pulmonary former smoker   Pulmonary exam normal breath sounds clear to auscultation       Cardiovascular negative cardio ROS Normal cardiovascular exam Rhythm:Regular Rate:Normal     Neuro/Psych   Anxiety Depression       GI/Hepatic negative GI ROS,,,(+) Cirrhosis   ascites      Endo/Other    Renal/GU negative Renal ROS     Musculoskeletal  (+) Arthritis ,    Abdominal   Peds  Hematology   Anesthesia Other Findings   Reproductive/Obstetrics                             Anesthesia Physical Anesthesia Plan  ASA: 2  Anesthesia Plan: MAC   Post-op Pain Management:    Induction:   PONV Risk Score and Plan: Treatment may vary due to age or medical condition and Propofol infusion  Airway Management Planned: Natural Airway and Nasal Cannula  Additional Equipment: None  Intra-op Plan:   Post-operative Plan:   Informed Consent: I have reviewed the patients History and Physical, chart, labs and discussed the procedure including the risks, benefits and alternatives for the proposed anesthesia with the patient or authorized representative who has indicated his/her understanding and acceptance.     Dental advisory given  Plan Discussed with: CRNA  Anesthesia Plan Comments: (EGD for portal hypertension)        Anesthesia Quick Evaluation

## 2023-01-27 ENCOUNTER — Encounter (HOSPITAL_COMMUNITY): Payer: Self-pay | Admitting: Internal Medicine

## 2023-01-27 ENCOUNTER — Ambulatory Visit (HOSPITAL_COMMUNITY): Payer: Self-pay

## 2023-01-27 ENCOUNTER — Ambulatory Visit (HOSPITAL_COMMUNITY)
Admission: RE | Admit: 2023-01-27 | Discharge: 2023-01-27 | Disposition: A | Discharge status: Home / Self Care | Payer: BC Managed Care – PPO | Source: Ambulatory Visit | Attending: Internal Medicine | Admitting: Internal Medicine

## 2023-01-27 ENCOUNTER — Other Ambulatory Visit: Payer: Self-pay

## 2023-01-27 ENCOUNTER — Ambulatory Visit (HOSPITAL_COMMUNITY): Payer: BC Managed Care – PPO

## 2023-01-27 ENCOUNTER — Encounter (HOSPITAL_COMMUNITY)
Admission: RE | Disposition: A | Discharge status: Home / Self Care | Payer: Self-pay | Source: Ambulatory Visit | Attending: Internal Medicine

## 2023-01-27 DIAGNOSIS — K449 Diaphragmatic hernia without obstruction or gangrene: Secondary | ICD-10-CM | POA: Insufficient documentation

## 2023-01-27 DIAGNOSIS — K209 Esophagitis, unspecified without bleeding: Secondary | ICD-10-CM | POA: Insufficient documentation

## 2023-01-27 DIAGNOSIS — K7469 Other cirrhosis of liver: Secondary | ICD-10-CM | POA: Insufficient documentation

## 2023-01-27 DIAGNOSIS — K766 Portal hypertension: Secondary | ICD-10-CM | POA: Insufficient documentation

## 2023-01-27 DIAGNOSIS — K3189 Other diseases of stomach and duodenum: Secondary | ICD-10-CM | POA: Diagnosis not present

## 2023-01-27 DIAGNOSIS — Z87891 Personal history of nicotine dependence: Secondary | ICD-10-CM | POA: Insufficient documentation

## 2023-01-27 DIAGNOSIS — K2950 Unspecified chronic gastritis without bleeding: Secondary | ICD-10-CM | POA: Diagnosis not present

## 2023-01-27 DIAGNOSIS — K319 Disease of stomach and duodenum, unspecified: Secondary | ICD-10-CM | POA: Diagnosis not present

## 2023-01-27 DIAGNOSIS — K2970 Gastritis, unspecified, without bleeding: Secondary | ICD-10-CM | POA: Diagnosis not present

## 2023-01-27 DIAGNOSIS — K8020 Calculus of gallbladder without cholecystitis without obstruction: Secondary | ICD-10-CM | POA: Diagnosis not present

## 2023-01-27 DIAGNOSIS — K2980 Duodenitis without bleeding: Secondary | ICD-10-CM | POA: Diagnosis not present

## 2023-01-27 HISTORY — DX: Pure hypercholesterolemia, unspecified: E78.00

## 2023-01-27 HISTORY — PX: ESOPHAGOGASTRODUODENOSCOPY (EGD) WITH PROPOFOL: SHX5813

## 2023-01-27 HISTORY — DX: Unspecified osteoarthritis, unspecified site: M19.90

## 2023-01-27 HISTORY — DX: Family history of other specified conditions: Z84.89

## 2023-01-27 HISTORY — PX: BIOPSY: SHX5522

## 2023-01-27 SURGERY — ESOPHAGOGASTRODUODENOSCOPY (EGD) WITH PROPOFOL
Anesthesia: Monitor Anesthesia Care

## 2023-01-27 MED ORDER — LIDOCAINE 2% (20 MG/ML) 5 ML SYRINGE
INTRAMUSCULAR | Status: DC | PRN
  Administered 2023-01-27: 100 mg via INTRAVENOUS

## 2023-01-27 MED ORDER — GLYCOPYRROLATE PF 0.2 MG/ML IJ SOSY
PREFILLED_SYRINGE | INTRAMUSCULAR | Status: DC | PRN
Start: 2023-01-27 — End: 2023-01-27
  Administered 2023-01-27: .2 mg via INTRAVENOUS

## 2023-01-27 MED ORDER — PROPOFOL 500 MG/50ML IV EMUL
INTRAVENOUS | Status: DC | PRN
  Administered 2023-01-27: 40 mg via INTRAVENOUS
  Administered 2023-01-27 (×2): 50 mg via INTRAVENOUS
  Administered 2023-01-27 (×2): 30 mg via INTRAVENOUS
  Administered 2023-01-27: 120 mg via INTRAVENOUS

## 2023-01-27 MED ORDER — LACTATED RINGERS IV SOLN
INTRAVENOUS | Status: AC | PRN
  Administered 2023-01-27: 1000 mL via INTRAVENOUS

## 2023-01-27 MED ORDER — PROPOFOL 500 MG/50ML IV EMUL
INTRAVENOUS | Status: AC
  Filled 2023-01-27: qty 50

## 2023-01-27 MED ORDER — SODIUM CHLORIDE 0.9 % IV SOLN: INTRAVENOUS | Status: DC

## 2023-01-27 SURGICAL SUPPLY — 15 items
BLOCK BITE 60FR ADLT L/F BLUE (MISCELLANEOUS) ×3 IMPLANT
ELECT REM PT RETURN 9FT ADLT (ELECTROSURGICAL)
ELECTRODE REM PT RTRN 9FT ADLT (ELECTROSURGICAL) IMPLANT
FORCEP RJ3 GP 1.8X160 W-NEEDLE (CUTTING FORCEPS) IMPLANT
FORCEPS BIOP RAD 4 LRG CAP 4 (CUTTING FORCEPS) IMPLANT
NDL SCLEROTHERAPY 25GX240 (NEEDLE) IMPLANT
NEEDLE SCLEROTHERAPY 25GX240 (NEEDLE)
PROBE APC STR FIRE (PROBE) IMPLANT
PROBE INJECTION GOLD (MISCELLANEOUS)
PROBE INJECTION GOLD 7FR (MISCELLANEOUS) IMPLANT
SNARE SHORT THROW 13M SML OVAL (MISCELLANEOUS) IMPLANT
SYR 50ML LL SCALE MARK (SYRINGE) IMPLANT
TUBING ENDO SMARTCAP PENTAX (MISCELLANEOUS) ×6 IMPLANT
TUBING IRRIGATION ENDOGATOR (MISCELLANEOUS) ×3 IMPLANT
WATER STERILE IRR 1000ML POUR (IV SOLUTION) IMPLANT

## 2023-01-27 NOTE — Transfer of Care (Signed)
Immediate Anesthesia Transfer of Care Note  Patient: [name] [name]  Procedure(s) Performed: ESOPHAGOGASTRODUODENOSCOPY (EGD) WITH PROPOFOL BIOPSY  Patient Location: PACU  Anesthesia Type:MAC  Level of Consciousness: awake  Airway & Oxygen Therapy: Patient Spontanous Breathing  Post-op Assessment: Report given to RN  Post vital signs: Reviewed  Last Vitals:  Vitals Value Taken Time  BP 108/68 01/27/23 1104  Temp    Pulse 80 01/27/23 1105  Resp 15 01/27/23 1105  SpO2 93 % 01/27/23 1105  Vitals shown include unfiled device data.  Last Pain:  Vitals:   01/27/23 0950  TempSrc: Tympanic  PainSc: 0-No pain         Complications: No notable events documented.

## 2023-01-27 NOTE — Discharge Instructions (Signed)

## 2023-01-27 NOTE — Anesthesia Postprocedure Evaluation (Signed)
Anesthesia Post Note  Patient: [name] [name]  Procedure(s) Performed: ESOPHAGOGASTRODUODENOSCOPY (EGD) WITH PROPOFOL BIOPSY     Patient location during evaluation: Endoscopy Anesthesia Type: MAC Level of consciousness: awake and alert Pain management: pain level controlled Vital Signs Assessment: post-procedure vital signs reviewed and stable Respiratory status: spontaneous breathing, nonlabored ventilation, respiratory function stable and patient connected to nasal cannula oxygen Cardiovascular status: blood pressure returned to baseline and stable Postop Assessment: no apparent nausea or vomiting Anesthetic complications: no   No notable events documented.  Last Vitals:  Vitals:   01/27/23 1113 01/27/23 1120  BP: 114/74 137/71  Pulse: 74 73  Resp: 17 15  Temp:    SpO2: 98% 99%    Last Pain:  Vitals:   01/27/23 1111  TempSrc:   PainSc: 0-No pain                 Trevor Iha

## 2023-01-27 NOTE — H&P (Signed)
[facility] H[facility]  Subjective: [name] [name] is a 57 y.o. male who presents for EGD to further evaluate clinically significant portal hypertension. Abdominal ultrasound imaging in May 2024 showed cholelithiasis, trace pericholecystic fluid, mildly nodular contour of liver. Elastography 12/25/22 showed median kPa 44.9. Serologic workup negative for autoimmune or viral hepatitis etiology. Presumed metabolic in nature. Seen in office on 01/17/23. No prior EGD. FOBT negative 08/14/22.   Today, no current abdominal pain, chest pain, shortness of breath.   Objective: General: Awake and alert, non-toxic in appearance Cardio: Regular rate and rhythm  Pulm: Clear to auscultation, no conversational dyspnea Abdomen: Soft, non-tender to palpation, bowel sounds appreciated    Assessment:  Clinically significant portal hypertension  Plan:  -Recommend EGD to screen for esophageal varices given clinically significant portal hypertension, do not suspect patient candidate for beta blocker therapy given bradycardia -Discussed procedure with patient including benefits, alternatives and risks of bleeding/infection/perforation/anesthesia, he verbalized understanding and is agreeable to band ligation if necessary  -Further recommendations to follow pending procedure   Liliane Shi, DO [facility]

## 2023-01-27 NOTE — Op Note (Signed)
[facility] Patient Name: [name] [name] Procedure Date: 01/27/2023 MRN: [id number] Attending MD: Liliane Shi DO, DO, [id number] Date of Birth: 01-Jan-1966 CSN: [id number] Age: 57 Admit Type: Inpatient Procedure:                Upper GI endoscopy Indications:              Portal hypertension rule out esophageal varices Providers:                Liliane Shi DO, DO, Suzy Bouchard, RN, Norman Clay,                            RN, Rozetta Nunnery, Technician Referring MD:              Medicines:                See the Anesthesia note for documentation of the                            administered medications Complications:            No immediate complications. Estimated Blood Loss:     Estimated blood loss was minimal. Procedure:                Pre-Anesthesia Assessment:                           - ASA Grade Assessment: II - A patient with mild                            systemic disease.                           - The risks and benefits of the procedure and the                            sedation options and risks were discussed with the                            patient. All questions were answered and informed                            consent was obtained.                           After obtaining informed consent, the endoscope was                            passed under direct vision. Throughout the                            procedure, the patient's blood pressure, pulse, and                            oxygen saturations were monitored continuously. The                            [id number]-[id number] (  [id number]) Olympus endoscope was introduced                            through the mouth, and advanced to the second part                            of duodenum. The upper GI endoscopy was                            accomplished without difficulty. The patient                            tolerated the procedure well. Scope In: Scope Out: Findings:      A 4 cm hiatal hernia  was present.      LA Grade C (one or more mucosal breaks continuous between tops of 2 or       more mucosal folds, less than 75% circumference) esophagitis with no       bleeding was found 40 cm from the incisors.      Scattered mild inflammation characterized by congestion (edema) and       erythema was found in the gastric antrum. Biopsies were taken with a       cold forceps for Helicobacter pylori testing.      Localized mild inflammation characterized by congestion (edema),       erosions and friability was found in the first portion of the duodenum. Impression:               - 4 cm hiatal hernia.                           - LA Grade C esophagitis with no bleeding.                           - Gastritis. Biopsied.                           - Duodenitis. Moderate Sedation:      See the other procedure note for documentation of moderate sedation with       intraservice time. Recommendation:           - Discharge patient to home.                           - Resume previous diet.                           - Continue present medications.                           - Await pathology results.                           - Use Prilosec (omeprazole) 40 mg PO daily for 2                            months.                           -  Repeat upper endoscopy in 3 months to evaluate                            the response to therapy.                           - Return to GI office in 6 months. Procedure Code(s):        --- Professional ---                           [phone], Esophagogastroduodenoscopy, flexible,                            transoral; with biopsy, single or multiple Diagnosis Code(s):        --- Professional ---                           K44.9, Diaphragmatic hernia without obstruction or                            gangrene                           K20.90, Esophagitis, unspecified without bleeding                           K29.70, Gastritis, unspecified, without bleeding                            K29.80, Duodenitis without bleeding                           K76.6, Portal hypertension CPT copyright 2022 [employer]. All rights reserved. The codes documented in this report are preliminary and upon coder review may  be revised to meet current compliance requirements. Dr Liliane Shi, DO Liliane Shi DO, DO 01/27/2023 11:13:11 AM Number of Addenda: 0

## 2023-01-28 LAB — SURGICAL PATHOLOGY

## 2023-01-30 ENCOUNTER — Encounter (HOSPITAL_COMMUNITY): Payer: Self-pay | Admitting: Internal Medicine

## 2023-04-15 DIAGNOSIS — K219 Gastro-esophageal reflux disease without esophagitis: Secondary | ICD-10-CM | POA: Diagnosis not present

## 2023-04-15 DIAGNOSIS — K449 Diaphragmatic hernia without obstruction or gangrene: Secondary | ICD-10-CM | POA: Diagnosis not present

## 2023-04-15 DIAGNOSIS — K2289 Other specified disease of esophagus: Secondary | ICD-10-CM | POA: Diagnosis not present

## 2023-04-15 DIAGNOSIS — K2980 Duodenitis without bleeding: Secondary | ICD-10-CM | POA: Diagnosis not present

## 2023-07-23 DIAGNOSIS — Z0000 Encounter for general adult medical examination without abnormal findings: Secondary | ICD-10-CM | POA: Diagnosis not present

## 2023-07-23 DIAGNOSIS — E7800 Pure hypercholesterolemia, unspecified: Secondary | ICD-10-CM | POA: Diagnosis not present

## 2023-07-23 DIAGNOSIS — F419 Anxiety disorder, unspecified: Secondary | ICD-10-CM | POA: Diagnosis not present

## 2023-07-23 DIAGNOSIS — R7303 Prediabetes: Secondary | ICD-10-CM | POA: Diagnosis not present

## 2023-07-23 DIAGNOSIS — Z125 Encounter for screening for malignant neoplasm of prostate: Secondary | ICD-10-CM | POA: Diagnosis not present

## 2024-02-01 DIAGNOSIS — R7303 Prediabetes: Secondary | ICD-10-CM | POA: Diagnosis not present
